# Patient Record
Sex: Male | Born: 1990 | Race: White | Hispanic: No | Marital: Married | Smoking: Former smoker
Health system: Southern US, Community
[De-identification: ages and names within clinical notes are randomized; demographics above are authoritative.]

## PROBLEM LIST (undated history)

## (undated) DIAGNOSIS — F410 Panic disorder [episodic paroxysmal anxiety] without agoraphobia: Secondary | ICD-10-CM

## (undated) DIAGNOSIS — K219 Gastro-esophageal reflux disease without esophagitis: Secondary | ICD-10-CM

---
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
## 2010-03-29 ENCOUNTER — Ambulatory Visit (HOSPITAL_COMMUNITY): Admission: RE | Admit: 2010-03-29 | Discharge: 2010-03-29 | Payer: Self-pay | Admitting: Family Medicine

## 2010-11-11 ENCOUNTER — Encounter: Payer: Self-pay | Admitting: Family Medicine

## 2011-02-14 ENCOUNTER — Emergency Department (HOSPITAL_COMMUNITY)
Admission: EM | Admit: 2011-02-14 | Discharge: 2011-02-14 | Discharge status: Against Medical Advice | Payer: Self-pay | Attending: Emergency Medicine | Admitting: Emergency Medicine

## 2011-07-30 LAB — URINALYSIS, ROUTINE W REFLEX MICROSCOPIC
Glucose, UA: NEGATIVE
Hgb urine dipstick: NEGATIVE
Specific Gravity, Urine: 1.025
pH: 6.5

## 2011-08-01 LAB — BASIC METABOLIC PANEL
CO2: 28
Chloride: 108
Creatinine, Ser: 1.07
GFR calc Af Amer: >60
Potassium: 3.6
Sodium: 140

## 2011-08-01 LAB — CBC
Hemoglobin: 14.8
MCHC: 33.5
MCV: 89.4
RBC: 4.94
WBC: 10

## 2011-08-01 LAB — DIFFERENTIAL
Eosinophils Absolute: 0.1
Lymphs Abs: 1.9
Monocytes Absolute: 0.7
Monocytes Relative: 7
Neutrophils Relative %: 73 — ABNORMAL HIGH

## 2011-08-01 LAB — URINALYSIS, ROUTINE W REFLEX MICROSCOPIC
Bilirubin Urine: NEGATIVE
Glucose, UA: NEGATIVE
Protein, ur: NEGATIVE
Specific Gravity, Urine: >1.03 — ABNORMAL HIGH

## 2011-08-01 LAB — URINE MICROSCOPIC-ADD ON

## 2013-07-06 ENCOUNTER — Encounter (HOSPITAL_COMMUNITY): Payer: Self-pay | Admitting: Emergency Medicine

## 2013-07-06 ENCOUNTER — Emergency Department (HOSPITAL_COMMUNITY)
Admission: EM | Admit: 2013-07-06 | Discharge: 2013-07-06 | Discharge status: Against Medical Advice | Payer: PRIVATE HEALTH INSURANCE | Attending: Emergency Medicine | Admitting: Emergency Medicine

## 2013-07-06 DIAGNOSIS — H6120 Impacted cerumen, unspecified ear: Secondary | ICD-10-CM | POA: Insufficient documentation

## 2013-07-06 DIAGNOSIS — H9209 Otalgia, unspecified ear: Secondary | ICD-10-CM | POA: Insufficient documentation

## 2013-07-06 DIAGNOSIS — F17200 Nicotine dependence, unspecified, uncomplicated: Secondary | ICD-10-CM | POA: Insufficient documentation

## 2013-07-06 NOTE — ED Notes (Signed)
Pt c/o left ear pain and states he cannot hear well. Pt also c/o dental pain.

## 2013-07-06 NOTE — ED Notes (Signed)
Pt c/o left ear pain and states he cannot hear well out of that ear.

## 2013-07-07 ENCOUNTER — Emergency Department (HOSPITAL_COMMUNITY)
Admission: EM | Admit: 2013-07-07 | Discharge: 2013-07-07 | Disposition: A | Discharge status: Home / Self Care | Payer: PRIVATE HEALTH INSURANCE | Attending: Emergency Medicine | Admitting: Emergency Medicine

## 2013-07-07 DIAGNOSIS — H6122 Impacted cerumen, left ear: Secondary | ICD-10-CM

## 2013-07-07 MED ORDER — CARBAMIDE PEROXIDE 6.5 % OT SOLN: 5.0000 [drp] | Freq: Two times a day (BID) | OTIC | Status: AC

## 2013-07-09 NOTE — ED Provider Notes (Signed)
CSN: [id number]     Arrival date & time 07/06/13  2201 History   First MD Initiated Contact with Patient 07/07/13 0006     Chief Complaint  Patient presents with  . Otalgia   (Consider location/radiation/quality/duration/timing/severity/associated sxs/prior Treatment) Patient is a 22 y.o. male presenting with ear pain. The history is provided by the patient.  Otalgia Location:  Left Behind ear:  No abnormality Quality:  Pressure and dull Severity:  Mild Onset quality:  Gradual Duration:  1 week Timing:  Constant Progression:  Worsening Chronicity:  New Context: not direct blow and not foreign body in ear   Relieved by:  Nothing Worsened by:  Nothing tried Ineffective treatments: He has tried to remove cerumen using a car key. Associated symptoms: hearing loss   Associated symptoms: no congestion, no ear discharge, no fever, no headaches, no neck pain, no sore throat and no tinnitus   Associated symptoms comment:  He reports the pain radiates to his left jaw   History reviewed. No pertinent past medical history. History reviewed. No pertinent past surgical history. History reviewed. No pertinent family history. History  Substance Use Topics  . Smoking status: Light Tobacco Smoker  . Smokeless tobacco: Not on file  . Alcohol Use: Yes    Review of Systems  Constitutional: Negative for fever.  HENT: Positive for hearing loss and ear pain. Negative for congestion, sore throat, facial swelling, neck pain, tinnitus and ear discharge.   Eyes: Negative.   Respiratory: Negative for shortness of breath.   Cardiovascular: Negative for chest pain.  Gastrointestinal: Negative for nausea.  Skin: Negative.   Neurological: Negative for dizziness and headaches.  Psychiatric/Behavioral: Negative.     Allergies  Chocolate  Home Medications   Current Outpatient Rx  Name  Route  Sig  Dispense  Refill  . carbamide peroxide (DEBROX) 6.5 % otic solution   Left Ear   Place 5 drops  into the left ear 2 (two) times daily.   15 mL   0    BP 143/77  Pulse 84  Temp(Src) 98.3 F (36.8 C) (Oral)  Resp 20  Ht 5\' 6"  (1.676 m)  Wt 193 lb (87.544 kg)  BMI 31.17 kg/m2  SpO2 99% Physical Exam  Constitutional: He is oriented to person, place, and time. He appears well-developed and well-nourished.  HENT:  Head: Normocephalic and atraumatic.  Right Ear: Tympanic membrane and ear canal normal.  Left Ear: A foreign body is present.  Nose: Mucosal edema and rhinorrhea present.  Mouth/Throat: Uvula is midline, oropharynx is clear and moist and mucous membranes are normal. No oropharyngeal exudate, posterior oropharyngeal edema, posterior oropharyngeal erythema or tonsillar abscesses.  Cerumen impaction left ear.  Eyes: Conjunctivae are normal.  Cardiovascular: Normal rate and normal heart sounds.   Pulmonary/Chest: Effort normal. No respiratory distress. He has no wheezes. He has no rales.  Abdominal: Soft. There is no tenderness.  Musculoskeletal: Normal range of motion.  Neurological: He is alert and oriented to person, place, and time.  Skin: Skin is warm and dry. No rash noted.  Psychiatric: He has a normal mood and affect.    ED Course  EAR CERUMEN REMOVAL Date/Time: 07/07/2013 12:45 AM Performed by: Burgess Amor Authorized by: Burgess Amor Consent: Verbal consent obtained. Risks and benefits: risks, benefits and alternatives were discussed Consent given by: patient Patient identity confirmed: verbally with patient Time out: Immediately prior to procedure a "time out" was called to verify the correct patient, procedure, equipment, support staff and  site/side marked as required. Ceruminolytics applied: Ceruminolytics applied prior to the procedure. Location details: left ear Procedure type: curette and irrigation Patient tolerance: Patient tolerated the procedure well with no immediate complications. Comments: Moderate amount of cerumen removed,  But still with  cerumen occluding the canal after multiple attempts to flush.  Unable to visualize the TM still after flushing   (including critical care time) Labs Review Labs Reviewed - No data to display Imaging Review No results found.  MDM   1. Cerumen impaction, left    Cerumen impaction.  Pt was prescribed debrox, encouraged to use daily for 5 days,  Then get rechecked by his pcp for additional irrigation.  Pt understands plan.    Burgess Amor, PA-C 07/09/13 1329

## 2013-07-11 NOTE — ED Provider Notes (Signed)
Medical screening examination/treatment/procedure(s) were performed by non-physician practitioner and as supervising physician I was immediately available for consultation/collaboration.  Geoffery Lyons, MD 07/11/13 [phone]

## 2013-12-03 ENCOUNTER — Emergency Department (HOSPITAL_COMMUNITY)
Admission: EM | Admit: 2013-12-03 | Discharge: 2013-12-03 | Disposition: A | Discharge status: Home / Self Care | Payer: PRIVATE HEALTH INSURANCE | Attending: Emergency Medicine | Admitting: Emergency Medicine

## 2013-12-03 ENCOUNTER — Encounter (HOSPITAL_COMMUNITY): Payer: Self-pay | Admitting: Emergency Medicine

## 2013-12-03 DIAGNOSIS — M545 Low back pain, unspecified: Secondary | ICD-10-CM

## 2013-12-03 DIAGNOSIS — R350 Frequency of micturition: Secondary | ICD-10-CM | POA: Insufficient documentation

## 2013-12-03 DIAGNOSIS — Z8659 Personal history of other mental and behavioral disorders: Secondary | ICD-10-CM | POA: Insufficient documentation

## 2013-12-03 DIAGNOSIS — M79609 Pain in unspecified limb: Secondary | ICD-10-CM | POA: Insufficient documentation

## 2013-12-03 DIAGNOSIS — F17200 Nicotine dependence, unspecified, uncomplicated: Secondary | ICD-10-CM | POA: Insufficient documentation

## 2013-12-03 HISTORY — DX: Panic disorder (episodic paroxysmal anxiety): F41.0

## 2013-12-03 LAB — URINALYSIS, ROUTINE W REFLEX MICROSCOPIC
BILIRUBIN URINE: NEGATIVE
Glucose, UA: NEGATIVE mg/dL
KETONES UR: NEGATIVE mg/dL
Leukocytes, UA: NEGATIVE
Nitrite: NEGATIVE
PROTEIN: NEGATIVE mg/dL
Specific Gravity, Urine: 1.025 (ref 1.005–1.030)
UROBILINOGEN UA: 1 mg/dL (ref 0.0–1.0)
pH: 6.5 (ref 5.0–8.0)

## 2013-12-03 LAB — URINE MICROSCOPIC-ADD ON

## 2013-12-03 MED ORDER — CYCLOBENZAPRINE HCL 10 MG PO TABS: 10.0000 mg | ORAL_TABLET | Freq: Three times a day (TID) | ORAL | Status: DC | PRN

## 2013-12-03 MED ORDER — IBUPROFEN 800 MG PO TABS: 800.0000 mg | ORAL_TABLET | Freq: Three times a day (TID) | ORAL | Status: DC

## 2013-12-03 NOTE — Discharge Instructions (Signed)
Back Pain, Adult  Back pain is very common. The pain often gets better over time. The cause of back pain is usually not dangerous. Most people can learn to manage their back pain on their own.   HOME CARE   · Stay active. Start with short walks on flat ground if you can. Try to walk farther each day.  · Do not sit, drive, or stand in one place for more than 30 minutes. Do not stay in bed.  · Do not avoid exercise or work. Activity can help your back heal faster.  · Be careful when you bend or lift an object. Bend at your knees, keep the object close to you, and do not twist.  · Sleep on a firm mattress. Lie on your side, and bend your knees. If you lie on your back, put a pillow under your knees.  · Only take medicines as told by your doctor.  · Put ice on the injured area.  · Put ice in a plastic bag.  · Place a towel between your skin and the bag.  · Leave the ice on for 15-20 minutes, 03-04 times a day for the first 2 to 3 days. After that, you can switch between ice and heat packs.  · Ask your doctor about back exercises or massage.  · Avoid feeling anxious or stressed. Find good ways to deal with stress, such as exercise.  GET HELP RIGHT AWAY IF:   · Your pain does not go away with rest or medicine.  · Your pain does not go away in 1 week.  · You have new problems.  · You do not feel well.  · The pain spreads into your legs.  · You cannot control when you poop (bowel movement) or pee (urinate).  · Your arms or legs feel weak or lose feeling (numbness).  · You feel sick to your stomach (nauseous) or throw up (vomit).  · You have belly (abdominal) pain.  · You feel like you may pass out (faint).  MAKE SURE YOU:   · Understand these instructions.  · Will watch your condition.  · Will get help right away if you are not doing well or get worse.  Document Released: 03/25/2008 Document Revised: 12/30/2011 Document Reviewed: 02/25/2011  ExitCare® Patient Information ©2014 [employer].

## 2013-12-03 NOTE — ED Notes (Signed)
Patient c/o lower back pain that started yesterday and is progressively getting worse. Denies any known injury. CNS intact.

## 2013-12-03 NOTE — ED Notes (Signed)
Ambulatory into tx room. PA has already examined, U/a to lab

## 2013-12-03 NOTE — ED Provider Notes (Signed)
CSN: [id number]     Arrival date & time 12/03/13  1655 History   First MD Initiated Contact with Patient 12/03/13 1731     Chief Complaint  Patient presents with  . Back Pain     (Consider location/radiation/quality/duration/timing/severity/associated sxs/prior Treatment) Patient is a 23 y.o. male presenting with back pain. The history is provided by the patient.  Back Pain Location:  Lumbar spine Quality:  Aching and stiffness Stiffness is present:  All day Radiates to:  L posterior upper leg and R posterior upper leg Pain severity:  Moderate Pain is:  Same all the time Onset quality:  Gradual Duration:  1 day Timing:  Constant Progression:  Unchanged Chronicity:  New Context: twisting   Context comment:  Patient is unsure of known injury but states that he does a lot of squatting at his job. Relieved by:  Bed rest and being still Worsened by:  Twisting, sitting and bending Ineffective treatments:  None tried Associated symptoms: leg pain   Associated symptoms: no abdominal pain, no abdominal swelling, no bladder incontinence, no bowel incontinence, no chest pain, no fever, no headaches, no numbness, no paresthesias, no pelvic pain, no perianal numbness, no tingling and no weakness   Associated symptoms comment:  Patient also states that he is urinating frequently but denies hematuria, penile discharge, flank pain, burning with urination or testicular tenderness Risk factors: no lack of exercise and no recent surgery     Past Medical History  Diagnosis Date  . Panic attacks    History reviewed. No pertinent past surgical history. Family History  Problem Relation Age of Onset  . Diabetes Other    History  Substance Use Topics  . Smoking status: Light Tobacco Smoker -- 0.03 packs/day for 9 years    Types: Cigarettes  . Smokeless tobacco: Former NeurosurgeonUser    Types: Chew  . Alcohol Use: 3.0 oz/week    5 Cans of beer per week    Review of Systems  Constitutional: Negative  for fever.  Respiratory: Negative for chest tightness and shortness of breath.   Cardiovascular: Negative for chest pain.  Gastrointestinal: Negative for nausea, vomiting, abdominal pain, constipation, abdominal distention and bowel incontinence.  Genitourinary: Positive for frequency. Negative for bladder incontinence, urgency, hematuria, flank pain, decreased urine volume, discharge, penile swelling, scrotal swelling, difficulty urinating, genital sores, penile pain, testicular pain and pelvic pain.       No perineal numbness or incontinence of urine or feces  Musculoskeletal: Positive for back pain. Negative for gait problem, joint swelling, neck pain and neck stiffness.  Skin: Negative for rash.  Neurological: Negative for dizziness, tingling, syncope, weakness, numbness, headaches and paresthesias.  All other systems reviewed and are negative.      Allergies  Chocolate and Sunflower oil  Home Medications  No current outpatient prescriptions on file. BP 134/71  Pulse 66  Temp(Src) 98.1 F (36.7 C) (Oral)  Resp 16  Ht 5\' 5"  (1.651 m)  Wt 200 lb (90.719 kg)  BMI 33.28 kg/m2  SpO2 99% Physical Exam  Nursing note and vitals reviewed. Constitutional: He is oriented to person, place, and time. He appears well-developed and well-nourished. No distress.  HENT:  Head: Normocephalic and atraumatic.  Neck: Normal range of motion. Neck supple.  Cardiovascular: Normal rate, regular rhythm, normal heart sounds and intact distal pulses.   No murmur heard. Pulmonary/Chest: Effort normal and breath sounds normal. No respiratory distress. He exhibits no tenderness.  Abdominal: Soft. Normal appearance. He exhibits no distension  and no mass. There is no hepatosplenomegaly. There is no tenderness. There is no rebound, no guarding and no CVA tenderness.  Musculoskeletal: He exhibits tenderness. He exhibits no edema.       Lumbar back: He exhibits tenderness and pain. He exhibits normal range of  motion, no swelling, no deformity, no laceration and normal pulse.  ttp of the mid lumbar spine and paraspinal muscles. DP pulses are brisk and symmetrical.  Distal sensation intact.  Hip Flexors/Extensors are intact  Neurological: He is alert and oriented to person, place, and time. He has normal strength. No sensory deficit. He exhibits normal muscle tone. Coordination and gait normal.  Reflex Scores:      Patellar reflexes are 2+ on the right side and 2+ on the left side.      Achilles reflexes are 2+ on the right side and 2+ on the left side. Skin: Skin is warm and dry. No rash noted.    ED Course  Procedures (including critical care time) Labs Review Labs Reviewed  URINALYSIS, ROUTINE W REFLEX MICROSCOPIC - Abnormal; Notable for the following:    Hgb urine dipstick SMALL (*)    All other components within normal limits  URINE MICROSCOPIC-ADD ON   Imaging Review No results found.  EKG Interpretation   None       MDM   Final diagnoses:  Low back pain   vitals, nursing notes and U/A results reviewed.     Diffuse lower back pain.  No CVA tenderness.  Pt reports urinary frequency w/o other urinary sx's.  No fever, abdominal pain, vomiting or CVA tenderness.  No hx of prostatitis or epididymitis .  Pt agrees to symptomatic treatment with ibuprofen and flexeril.  Restrict return precautions given.  He is ambulatory, NV intact.  Appears stable for d/c and agrees to plan.       Bryceson Grape L. Trisha Mangle, PA-C 12/05/13 1202

## 2013-12-05 NOTE — ED Provider Notes (Signed)
  Medical screening examination/treatment/procedure(s) were performed by non-physician practitioner and as supervising physician I was immediately available for consultation/collaboration.      Shallon Yaklin, MD 12/05/13 1518 

## 2013-12-11 ENCOUNTER — Encounter (HOSPITAL_COMMUNITY): Payer: Self-pay | Admitting: Emergency Medicine

## 2013-12-11 ENCOUNTER — Emergency Department (HOSPITAL_COMMUNITY): Payer: PRIVATE HEALTH INSURANCE

## 2013-12-11 ENCOUNTER — Emergency Department (HOSPITAL_COMMUNITY)
Admission: EM | Admit: 2013-12-11 | Discharge: 2013-12-11 | Disposition: A | Discharge status: Home / Self Care | Payer: PRIVATE HEALTH INSURANCE | Attending: Emergency Medicine | Admitting: Emergency Medicine

## 2013-12-11 DIAGNOSIS — F17200 Nicotine dependence, unspecified, uncomplicated: Secondary | ICD-10-CM | POA: Insufficient documentation

## 2013-12-11 DIAGNOSIS — F411 Generalized anxiety disorder: Secondary | ICD-10-CM | POA: Insufficient documentation

## 2013-12-11 DIAGNOSIS — R51 Headache: Secondary | ICD-10-CM | POA: Insufficient documentation

## 2013-12-11 DIAGNOSIS — R072 Precordial pain: Secondary | ICD-10-CM | POA: Insufficient documentation

## 2013-12-11 DIAGNOSIS — R55 Syncope and collapse: Secondary | ICD-10-CM

## 2013-12-11 DIAGNOSIS — R110 Nausea: Secondary | ICD-10-CM | POA: Insufficient documentation

## 2013-12-11 DIAGNOSIS — R42 Dizziness and giddiness: Secondary | ICD-10-CM | POA: Insufficient documentation

## 2013-12-11 DIAGNOSIS — F419 Anxiety disorder, unspecified: Secondary | ICD-10-CM

## 2013-12-11 LAB — RAPID URINE DRUG SCREEN, HOSP PERFORMED
AMPHETAMINES: NOT DETECTED
BENZODIAZEPINES: NOT DETECTED
Barbiturates: NOT DETECTED
Cocaine: NOT DETECTED
OPIATES: NOT DETECTED
Tetrahydrocannabinol: NOT DETECTED

## 2013-12-11 LAB — URINALYSIS, ROUTINE W REFLEX MICROSCOPIC
Bilirubin Urine: NEGATIVE
GLUCOSE, UA: NEGATIVE mg/dL
HGB URINE DIPSTICK: NEGATIVE
Ketones, ur: NEGATIVE mg/dL
LEUKOCYTES UA: NEGATIVE
Nitrite: NEGATIVE
PH: 6 (ref 5.0–8.0)
PROTEIN: NEGATIVE mg/dL
Specific Gravity, Urine: 1.025 (ref 1.005–1.030)
Urobilinogen, UA: 0.2 mg/dL (ref 0.0–1.0)

## 2013-12-11 LAB — CBC WITH DIFFERENTIAL/PLATELET
BASOS ABS: 0 10*3/uL (ref 0.0–0.1)
BASOS PCT: 0 % (ref 0–1)
EOS ABS: 0.1 10*3/uL (ref 0.0–0.7)
EOS PCT: 1 % (ref 0–5)
HEMATOCRIT: 42.4 % (ref 39.0–52.0)
HEMOGLOBIN: 14.5 g/dL (ref 13.0–17.0)
Lymphocytes Relative: 18 % (ref 12–46)
Lymphs Abs: 1.6 10*3/uL (ref 0.7–4.0)
MCH: 30.4 pg (ref 26.0–34.0)
MCHC: 34.2 g/dL (ref 30.0–36.0)
MCV: 88.9 fL (ref 78.0–100.0)
MONO ABS: 0.5 10*3/uL (ref 0.1–1.0)
MONOS PCT: 6 % (ref 3–12)
Neutro Abs: 6.6 10*3/uL (ref 1.7–7.7)
Neutrophils Relative %: 76 % (ref 43–77)
Platelets: 190 10*3/uL (ref 150–400)
RBC: 4.77 MIL/uL (ref 4.22–5.81)
RDW: 12.3 % (ref 11.5–15.5)
WBC: 8.7 10*3/uL (ref 4.0–10.5)

## 2013-12-11 LAB — COMPREHENSIVE METABOLIC PANEL
ALBUMIN: 3.9 g/dL (ref 3.5–5.2)
ALT: 57 U/L — ABNORMAL HIGH (ref 0–53)
AST: 32 U/L (ref 0–37)
Alkaline Phosphatase: 46 U/L (ref 39–117)
BILIRUBIN TOTAL: 0.6 mg/dL (ref 0.3–1.2)
BUN: 19 mg/dL (ref 6–23)
CALCIUM: 9.2 mg/dL (ref 8.4–10.5)
CO2: 28 mEq/L (ref 19–32)
CREATININE: 1.09 mg/dL (ref 0.50–1.35)
Chloride: 102 mEq/L (ref 96–112)
GFR calc Af Amer: >90 mL/min (ref >90)
Glucose, Bld: 104 mg/dL — ABNORMAL HIGH (ref 70–99)
Potassium: 3.9 mEq/L (ref 3.7–5.3)
Sodium: 139 mEq/L (ref 137–147)
Total Protein: 7.1 g/dL (ref 6.0–8.3)

## 2013-12-11 LAB — D-DIMER, QUANTITATIVE (NOT AT ARMC)

## 2013-12-11 LAB — TROPONIN I

## 2013-12-11 NOTE — ED Provider Notes (Signed)
CSN: [id number]     Arrival date & time 12/11/13  1751 History   This chart was scribed for Glynn Octave, MD by Bennett Scrape, ED Scribe. This patient was seen in room [facility]10/[facility]10 and the patient's care was started at 6:10 PM.   Chief Complaint  Patient presents with  . Anxiety     The history is provided by the patient. No language interpreter was used.    HPI Comments: Derek White is a 23 y.o. male with a h/o anxiety currently unmedicated who presents to the Emergency Department by ambulance complaining of one episode of syncope while at work that he attributes to anxiety. Pt states that he was standing up taking the trash out when he felt lightheaded and nauseated when he "blacked out" 1 second later. He denies any blurred or double vision at the time. He denies any head trauma. He states that he was found by his coworkers. He reports having mid-sternal to left CP that started 30 minutes and a mild HA prior to the syncopal that he attributes to anxiety. He has a h/o panic attacks and reports that he felt a panic attack starting at work prior to the syncopal episode. He denies any lightheadedness, CP, HA or nausea currently. He denies any neck or back pain. He denies SI or HI. He denies being on any medications for anxiety. He denies being on any daily medications.    Past Medical History  Diagnosis Date  . Panic attacks    History reviewed. No pertinent past surgical history. Family History  Problem Relation Age of Onset  . Diabetes Other    History  Substance Use Topics  . Smoking status: Light Tobacco Smoker -- 0.03 packs/day for 9 years    Types: Cigarettes  . Smokeless tobacco: Former Neurosurgeon    Types: Chew  . Alcohol Use: 3.0 oz/week    5 Cans of beer per week    Review of Systems  A complete 10 system review of systems was obtained and all systems are negative except as noted in the HPI and PMH.   Allergies  Cinnamon; Chocolate; and Sunflower oil  Home  Medications   No current outpatient prescriptions on file. Triage Vitals: BP 129/67  Pulse 60  Temp(Src) 98 F (36.7 C)  Resp 12  Ht 5\' 5"  (1.651 m)  Wt 200 lb (90.719 kg)  BMI 33.28 kg/m2  SpO2 98%  Physical Exam  Nursing note and vitals reviewed. Constitutional: He is oriented to person, place, and time. He appears well-developed and well-nourished. No distress.  HENT:  Head: Normocephalic and atraumatic.  Eyes: Conjunctivae and EOM are normal. Pupils are equal, round, and reactive to light.  Neck: Neck supple. No tracheal deviation present.  Cardiovascular: Normal rate and regular rhythm.   No murmur heard. Pulmonary/Chest: Effort normal and breath sounds normal. No respiratory distress.  Abdominal: Soft. There is no tenderness. There is no rebound and no guarding.  Musculoskeletal: Normal range of motion.  No C, T or L spine tenderness   Neurological: He is alert and oriented to person, place, and time.  Slow to respond, CN 2-12 intact, no ataxia on finger to nose, no nystagmus, 5/5 strength throughout, no pronator drift  Skin: Skin is warm and dry.  Psychiatric: He has a normal mood and affect. His behavior is normal.    ED Course  Procedures (including critical care time)  DIAGNOSTIC STUDIES: Oxygen Saturation is 98% on RA, normal by my interpretation.  COORDINATION OF CARE: 6:16 PM-Discussed treatment plan which includes CXR, CBC panel, CMP and D-dimer with pt at bedside and pt agreed to plan.   7:47 PM- Cardiologist reports that EKG has no concerning changes.  8:17 PM-Pt rechecked and feels improved. Informed pt of wok up thus far. Advised pt that I believe his symptoms were a vasovagal response to going from a sitting position to a standing position quickly. Will work towards discharge and pt is agreeable.   Labs Review Labs Reviewed  COMPREHENSIVE METABOLIC PANEL - Abnormal; Notable for the following:    Glucose, Bld 104 (*)    ALT 57 (*)    All other  components within normal limits  CBC WITH DIFFERENTIAL  TROPONIN I  D-DIMER, QUANTITATIVE  URINE RAPID DRUG SCREEN (HOSP PERFORMED)  URINALYSIS, ROUTINE W REFLEX MICROSCOPIC   Imaging Review Dg Chest 2 View  12/11/2013   CLINICAL DATA:  Syncope.  EXAM: CHEST  2 VIEW  COMPARISON:  None.  FINDINGS: The heart size and mediastinal contours are within normal limits. Both lungs are clear. The visualized skeletal structures are unremarkable.  IMPRESSION: No active cardiopulmonary disease.   Electronically Signed   By: Myles RosenthalJohn  Stahl M.D.   On: 12/11/2013 19:31   Ct Head Wo Contrast  12/11/2013   CLINICAL DATA:  Anxiety.  EXAM: CT HEAD WITHOUT CONTRAST  TECHNIQUE: Contiguous axial images were obtained from the base of the skull through the vertex without intravenous contrast.  COMPARISON:  None.  FINDINGS: No mass. No hydrocephalus. No hemorrhage. Orbits are unremarkable. Paranasal sinuses are clear. No acute bony abnormality.  IMPRESSION: Negative exam.   Electronically Signed   By: Maisie Fushomas  Register   On: 12/11/2013 19:38    EKG Interpretation    Date/Time:  Saturday December 11 2013 20:48:31 EST Ventricular Rate:  52 PR Interval:  174 QRS Duration: 96 QT Interval:  400 QTC Calculation: 372 R Axis:   35 Text Interpretation:  Sinus bradycardia Incomplete right bundle branch block Borderline ECG When compared with ECG of 28-Jul-2007 18:36, incomplete RBBB new Confirmed by Manus GunningANCOUR  MD, Beryle Zeitz ([id number]) on 12/11/2013 8:58:32 PM            MDM   Final diagnoses:  Syncope  Anxiety  syncopal episode at work after patient admits to being stressed out and having some tightness in his chest. Proceeded by lightheadedness and dizziness. Alert and oriented at this time. Denies pain complaints. No reported seizure activity.  EKG shows incomplete right bundle branch block. Neurologically intact. No focal deficits.  EKG discussed with on-call cardiology follow Dr. Hiram ComberLishmanov. He does not feel it  represents Brugada syndrome.  CT head negative. Troponin neg, D-dimer negative.  Ambulatory without assistance. Syncopal episode is likely vasovagal with component of anxiety. Patient feeling better and tolerating PO.  Denies SI or HI.  Follow up with PCP and cardiology as needed.  Date: 12/11/2013  Rate: 60  Rhythm: normal sinus rhythm  QRS Axis: normal  Intervals: normal  ST/T Wave abnormalities: nonspecific ST/T changes  Conduction Disutrbances:right bundle branch block  Narrative Interpretation: incomplete RBBB.  Old EKG Reviewed: none available    I personally performed the services described in this documentation, which was scribed in my presence. The recorded information has been reviewed and is accurate.      Glynn OctaveStephen Arael Piccione, MD 12/11/13 2226

## 2013-12-11 NOTE — ED Notes (Signed)
Per ems patient became stressed at work and passed out. ems states he was alert and oriented at time of arrival

## 2013-12-11 NOTE — Discharge Instructions (Signed)
Syncope There is no evidence of a serious cause of your passing out. Followup with her doctor. You should see the cardiologist as well she can be monitored for any abnormal heart rhythms. Return to the ED if you develop new or worsening symptoms. Syncope is a fainting spell. This means the person loses consciousness and drops to the ground. The person is generally unconscious for less than 5 minutes. The person may have some muscle twitches for up to 15 seconds before waking up and returning to normal. Syncope occurs more often in elderly people, but it can happen to anyone. While most causes of syncope are not dangerous, syncope can be a sign of a serious medical problem. It is important to seek medical care.  CAUSES  Syncope is caused by a sudden decrease in blood flow to the brain. The specific cause is often not determined. Factors that can trigger syncope include:  Taking medicines that lower blood pressure.  Sudden changes in posture, such as standing up suddenly.  Taking more medicine than prescribed.  Standing in one place for too long.  Seizure disorders.  Dehydration and excessive exposure to heat.  Low blood sugar (hypoglycemia).  Straining to have a bowel movement.  Heart disease, irregular heartbeat, or other circulatory problems.  Fear, emotional distress, seeing blood, or severe pain. SYMPTOMS  Right before fainting, you may:  Feel dizzy or lightheaded.  Feel nauseous.  See all white or all black in your field of vision.  Have cold, clammy skin. DIAGNOSIS  Your caregiver will ask about your symptoms, perform a physical exam, and perform electrocardiography (ECG) to record the electrical activity of your heart. Your caregiver may also perform other heart or blood tests to determine the cause of your syncope. TREATMENT  In most cases, no treatment is needed. Depending on the cause of your syncope, your caregiver may recommend changing or stopping some of your  medicines. HOME CARE INSTRUCTIONS  Have someone stay with you until you feel stable.  Do not drive, operate machinery, or play sports until your caregiver says it is okay.  Keep all follow-up appointments as directed by your caregiver.  Lie down right away if you start feeling like you might faint. Breathe deeply and steadily. Wait until all the symptoms have passed.  Drink enough fluids to keep your urine clear or pale yellow.  If you are taking blood pressure or heart medicine, get up slowly, taking several minutes to sit and then stand. This can reduce dizziness. SEEK IMMEDIATE MEDICAL CARE IF:   You have a severe headache.  You have unusual pain in the chest, abdomen, or back.  You are bleeding from the mouth or rectum, or you have black or tarry stool.  You have an irregular or very fast heartbeat.  You have pain with breathing.  You have repeated fainting or seizure-like jerking during an episode.  You faint when sitting or lying down.  You have confusion.  You have difficulty walking.  You have severe weakness.  You have vision problems. If you fainted, call your local emergency services (911 in [place] Do not drive yourself to the hospital.  MAKE SURE YOU:  Understand these instructions.  Will watch your condition.  Will get help right away if you are not doing well or get worse. Document Released: 10/07/2005 Document Revised: 04/07/2012 Document Reviewed: 12/06/2011 [facility] Patient Information 2014 [employer]Care, [place].

## 2014-10-06 ENCOUNTER — Emergency Department (HOSPITAL_COMMUNITY)
Admission: EM | Admit: 2014-10-06 | Discharge: 2014-10-06 | Disposition: A | Discharge status: Home / Self Care | Payer: PRIVATE HEALTH INSURANCE | Attending: Emergency Medicine | Admitting: Emergency Medicine

## 2014-10-06 ENCOUNTER — Encounter (HOSPITAL_COMMUNITY): Payer: Self-pay | Admitting: Emergency Medicine

## 2014-10-06 DIAGNOSIS — Z8659 Personal history of other mental and behavioral disorders: Secondary | ICD-10-CM | POA: Insufficient documentation

## 2014-10-06 DIAGNOSIS — Z0289 Encounter for other administrative examinations: Secondary | ICD-10-CM | POA: Insufficient documentation

## 2014-10-06 DIAGNOSIS — Z720 Tobacco use: Secondary | ICD-10-CM | POA: Insufficient documentation

## 2014-10-06 DIAGNOSIS — Z139 Encounter for screening, unspecified: Secondary | ICD-10-CM

## 2014-10-06 NOTE — ED Notes (Signed)
Patient reports having vomiting x2 this morning. Denies any pain, diarrhea, or pain. Per patient no longer vomiting. Denies any nausea. Per patient "basically had to come for work note."

## 2014-10-08 NOTE — ED Provider Notes (Signed)
CSN: [id number]     Arrival date & time 10/06/14  1653 History   First MD Initiated Contact with Patient 10/06/14 1752     Chief Complaint  Patient presents with  . Emesis     (Consider location/radiation/quality/duration/timing/severity/associated sxs/prior Treatment) HPI   Derek White is a 23 y.o. male who presents to the Emergency Department requesting a return to work note.  He states that he had two episodes of vomiting this morning and did not feel well enough to go to work.  He states that he now feels better and has tolerated fluids and food without further vomiting.  He denies nausea., fever, abdominal pain, diarrhea or further vomiting.     Past Medical History  Diagnosis Date  . Panic attacks    History reviewed. No pertinent past surgical history. Family History  Problem Relation Age of Onset  . Diabetes Other    History  Substance Use Topics  . Smoking status: Light Tobacco Smoker -- 0.03 packs/day for 9 years    Types: Cigarettes  . Smokeless tobacco: Former NeurosurgeonUser    Types: Chew  . Alcohol Use: 3.0 oz/week    5 Cans of beer per week    Review of Systems  Constitutional: Negative for fever, chills and appetite change.  Respiratory: Negative for shortness of breath.   Cardiovascular: Negative for chest pain.  Gastrointestinal: Negative for nausea, vomiting, abdominal pain, blood in stool and abdominal distention.  Genitourinary: Negative for dysuria, flank pain, decreased urine volume and difficulty urinating.  Musculoskeletal: Negative for back pain.  Skin: Negative for color change and rash.  Neurological: Negative for dizziness, weakness and numbness.  Hematological: Negative for adenopathy.  All other systems reviewed and are negative.     Allergies  Cinnamon; Chocolate; and Sunflower oil  Home Medications   Prior to Admission medications   Not on File   BP 132/63 mmHg  Pulse 61  Temp(Src) 98.4 F (36.9 C) (Oral)  Resp 16  Ht 5'  5" (1.651 m)  Wt 180 lb (81.647 kg)  BMI 29.95 kg/m2  SpO2 100% Physical Exam  Constitutional: He is oriented to person, place, and time. He appears well-developed and well-nourished. No distress.  HENT:  Head: Normocephalic and atraumatic.  Mouth/Throat: Oropharynx is clear and moist.  Neck: Neck supple.  Cardiovascular: Normal rate, regular rhythm and normal heart sounds.   No murmur heard. Pulmonary/Chest: Effort normal and breath sounds normal. No respiratory distress.  Abdominal: Soft. He exhibits no distension. There is no tenderness. There is no rebound and no guarding.  Musculoskeletal: Normal range of motion. He exhibits no tenderness.  Neurological: He is alert and oriented to person, place, and time. He exhibits normal muscle tone. Coordination normal.  Skin: Skin is warm and dry.  Nursing note and vitals reviewed.   ED Course  Procedures (including critical care time) Labs Review Labs Reviewed - No data to display  Imaging Review No results found.   EKG Interpretation None      MDM   Final diagnoses:  Encounter for medical screening examination    Pt here requesting a return ot work note.  Had two episodes of vomiting earlier, but sx's now resolved. abd is soft, NT.  Patient is well appearing, VSS.  Work note written.        Derek Schlender L. Trisha Mangleriplett, PA-C 10/08/14 1829  Derek BakerAnthony T Allen, MD 10/10/14 [phone]

## 2014-10-27 ENCOUNTER — Encounter (HOSPITAL_COMMUNITY): Payer: Self-pay | Admitting: *Deleted

## 2014-10-27 ENCOUNTER — Emergency Department (HOSPITAL_COMMUNITY)
Admission: EM | Admit: 2014-10-27 | Discharge: 2014-10-27 | Disposition: A | Discharge status: Home / Self Care | Payer: PRIVATE HEALTH INSURANCE | Attending: Emergency Medicine | Admitting: Emergency Medicine

## 2014-10-27 DIAGNOSIS — Z8659 Personal history of other mental and behavioral disorders: Secondary | ICD-10-CM | POA: Diagnosis not present

## 2014-10-27 DIAGNOSIS — H9203 Otalgia, bilateral: Secondary | ICD-10-CM | POA: Diagnosis present

## 2014-10-27 DIAGNOSIS — H6123 Impacted cerumen, bilateral: Secondary | ICD-10-CM

## 2014-10-27 DIAGNOSIS — Z720 Tobacco use: Secondary | ICD-10-CM | POA: Diagnosis not present

## 2014-10-27 DIAGNOSIS — H6501 Acute serous otitis media, right ear: Secondary | ICD-10-CM

## 2014-10-27 DIAGNOSIS — H6591 Unspecified nonsuppurative otitis media, right ear: Secondary | ICD-10-CM | POA: Diagnosis not present

## 2014-10-27 MED ORDER — AMOXICILLIN 500 MG PO CAPS: 500.0000 mg | ORAL_CAPSULE | Freq: Three times a day (TID) | ORAL | Status: DC

## 2014-10-27 MED ORDER — NEOMYCIN-POLYMYXIN-HC 3.5-10000-1 OT SUSP: 4.0000 [drp] | Freq: Three times a day (TID) | OTIC | Status: DC

## 2014-10-27 NOTE — ED Notes (Signed)
Pt with right earache for 5 days, same with left ear recently, pt with c/o HA and nausea, denies vomiting or diarrhea

## 2014-10-27 NOTE — ED Provider Notes (Signed)
CSN: [id number]     Arrival date & time 10/27/14  1626 History   First MD Initiated Contact with Patient 10/27/14 1651     Chief Complaint  Patient presents with  . Otalgia     (Consider location/radiation/quality/duration/timing/severity/associated sxs/prior Treatment) Patient is a 24 y.o. male presenting with ear pain. The history is provided by the patient.  Otalgia Location:  Bilateral Quality:  Aching and dull Severity:  Moderate Onset quality:  Gradual Duration:  5 days Timing:  Constant Progression:  Worsening Chronicity:  New Relieved by:  Nothing Ineffective treatments:  None tried  Derek White is a 24 y.o. male who presents to the ED with bilateral ear pain x 5 days. He states that his hearing is decreased, his ears feel full and often fells popping. He has noted a lot of wax. He denies fever or URI symptoms.  Past Medical History  Diagnosis Date  . Panic attacks    History reviewed. No pertinent past surgical history. Family History  Problem Relation Age of Onset  . Diabetes Other    History  Substance Use Topics  . Smoking status: Light Tobacco Smoker -- 0.03 packs/day for 9 years    Types: Cigarettes  . Smokeless tobacco: Former NeurosurgeonUser    Types: Chew  . Alcohol Use: 3.0 oz/week    5 Cans of beer per week    Review of Systems  HENT: Positive for ear pain.   all other systems negative.     Allergies  Cinnamon; Chocolate; and Sunflower oil  Home Medications   Prior to Admission medications   Medication Sig Start Date End Date Taking? Authorizing Provider  amoxicillin (AMOXIL) 500 MG capsule Take 1 capsule (500 mg total) by mouth 3 (three) times daily. 10/27/14   Terri Malerba Orlene OchM Margie Urbanowicz, NP  neomycin-polymyxin-hydrocortisone (CORTISPORIN) 3.5-10000-1 otic suspension Place 4 drops into both ears 3 (three) times daily. 10/27/14   Derek White Orlene OchM Shuan Statzer, NP   BP 124/85 mmHg  Pulse 77  Temp(Src) 98.1 F (36.7 C) (Oral)  Resp 18  Ht 5\' 5"  (1.651 m)  Wt 215 lb 5 oz  (97.665 kg)  BMI 35.83 kg/m2  SpO2 100% Physical Exam  Constitutional: He is oriented to person, place, and time. He appears well-developed and well-nourished. No distress.  HENT:  Head: Normocephalic.  Bilateral TM occluded by cerumen.   Eyes: EOM are normal.  Neck: Normal range of motion. Neck supple.  Cardiovascular: Normal rate.   Pulmonary/Chest: Effort normal.  Musculoskeletal: Normal range of motion.  Neurological: He is alert and oriented to person, place, and time. No cranial nerve deficit.  Skin: Skin is warm and dry.  Psychiatric: He has a normal mood and affect. His behavior is normal.  Nursing note and vitals reviewed.   ED Course  Procedures (including critical care time) Labs Review Ear irrigation with good results, large pieces of cerumen removed from both ear canals.  Patient tolerated the procedure without problems.  Patient re examined, right TM with erythema. MDM  24 y.o. male with right ear pain x 5 days and both ears feeling full. Stable for discharge without tenderness to the mastoid. Will treat for otitis media right and he will follow up with his doctor in one week or return here sooner for worsening symptoms.    Medication List    TAKE these medications        amoxicillin 500 MG capsule  Commonly known as:  AMOXIL  Take 1 capsule (500 mg total) by mouth  3 (three) times daily.     neomycin-polymyxin-hydrocortisone 3.5-10000-1 otic suspension  Commonly known as:  CORTISPORIN  Place 4 drops into both ears 3 (three) times daily.        Final diagnoses:  Cerumen impaction, bilateral  Right acute serous otitis media, recurrence not specified      Janne Napoleon, NP 10/28/14 0151  Juliet Rude. Rubin Payor, MD 10/28/14 [phone]

## 2014-10-27 NOTE — Discharge Instructions (Signed)
Follow up with your doctor in one week for recheck.

## 2016-09-20 ENCOUNTER — Emergency Department (HOSPITAL_COMMUNITY)
Admission: EM | Admit: 2016-09-20 | Discharge: 2016-09-21 | Disposition: A | Discharge status: Home / Self Care | Payer: PRIVATE HEALTH INSURANCE | Attending: Emergency Medicine | Admitting: Emergency Medicine

## 2016-09-20 ENCOUNTER — Encounter (HOSPITAL_COMMUNITY): Payer: Self-pay | Admitting: Emergency Medicine

## 2016-09-20 DIAGNOSIS — F17210 Nicotine dependence, cigarettes, uncomplicated: Secondary | ICD-10-CM | POA: Insufficient documentation

## 2016-09-20 DIAGNOSIS — H66002 Acute suppurative otitis media without spontaneous rupture of ear drum, left ear: Secondary | ICD-10-CM

## 2016-09-20 NOTE — ED Triage Notes (Signed)
Pain to lt ear x 3days

## 2016-09-21 MED ORDER — AMOXICILLIN 500 MG PO CAPS: 500.0000 mg | ORAL_CAPSULE | Freq: Three times a day (TID) | ORAL | 0 refills | Status: DC

## 2016-09-21 NOTE — ED Provider Notes (Signed)
[facility]-EMERGENCY DEPT Provider Note   CSN: [id number] Arrival date & time: 09/20/16  2334  By signing my name below, I, Derek JanskyAlbert White, attest that this documentation has been prepared under the direction and in the presence of Azalia BilisKevin Eriyah Fernando, MD . Electronically Signed: Modena JanskyAlbert White, Scribe. 09/21/2016. 12:17 AM.  History   Chief Complaint Chief Complaint  Patient presents with  . Otalgia   The history is provided by the patient. No language interpreter was used.    HPI Comments: Derek White A Parekh is a 25 y.o. male who presents to the Emergency Department complaining of constant moderate left ear pain that started 3 days ago ago. He describes the pain as radiating to his cheek. He tried sticking a bobby pin into the ear canal for pain without any relief. He admits to a prior hx of similar complaint due to cerumen impaction. He denies any sore throat, fever, or other complaints.    PCP: Derek RuthsMCGOUGH,WILLIAM M, MD  Past Medical History:  Diagnosis Date  . Panic attacks     There are no active problems to display for this patient.   History reviewed. No pertinent surgical history.     Home Medications    Prior to Admission medications   Medication Sig Start Date End Date Taking? Authorizing Provider  amoxicillin (AMOXIL) 500 MG capsule Take 1 capsule (500 mg total) by mouth 3 (three) times daily. 10/27/14  Yes Derek Orlene OchM Neese, NP  neomycin-polymyxin-hydrocortisone (CORTISPORIN) 3.5-10000-1 otic suspension Place 4 drops into both ears 3 (three) times daily. 10/27/14  Yes Derek Orlene OchM Neese, NP    Family History Family History  Problem Relation Age of Onset  . Diabetes Other     Social History Social History  Substance Use Topics  . Smoking status: Light Tobacco Smoker    Years: 9.00    Types: E-cigarettes  . Smokeless tobacco: Former NeurosurgeonUser    Types: Chew  . Alcohol use 3.0 oz/week    5 Cans of beer per week     Comment: 12 pack per week      Allergies   Cinnamon; Chocolate;  and Sunflower oil   Review of Systems Review of Systems A complete 10 system review of systems was obtained and all systems are negative except as noted in the HPI and PMH.   Physical Exam Updated Vital Signs BP 141/93 (BP Location: Left Arm)   Pulse 102   Temp 98.3 F (36.8 C) (Oral)   Resp 20   Ht 5\' 8"  (1.727 m)   Wt 215 lb (97.5 kg)   SpO2 100%   BMI 32.69 kg/m   Physical Exam  Constitutional: He is oriented to person, place, and time. He appears well-developed and well-nourished.  HENT:  Head: Normocephalic.  Mild erythema of the left TM with mild TM bulging.   Eyes: EOM are normal.  Neck: Normal range of motion.  Small lymphadenopathy to left lateral neck.   Pulmonary/Chest: Effort normal.  Abdominal: He exhibits no distension.  Musculoskeletal: Normal range of motion.  Neurological: He is alert and oriented to person, place, and time.  Psychiatric: He has a normal mood and affect.  Nursing note and vitals reviewed.    ED Treatments / Results  DIAGNOSTIC STUDIES: Oxygen Saturation is 100% on RA, normal by my interpretation.    COORDINATION OF CARE: 12:20 AM- Pt advised of plan for treatment and pt agrees.  Labs (all labs ordered are listed, but only abnormal results are displayed) Labs Reviewed - No data  to display  EKG  EKG Interpretation None       Radiology No results found.  Procedures Procedures (including critical care time)  Medications Ordered in ED Medications - No data to display   Initial Impression / Assessment and Plan / ED Course  I have reviewed the triage vital signs and the nursing notes.  Pertinent labs & imaging results that were available during my care of the patient were reviewed by me and considered in my medical decision making (see chart for details).  Clinical Course     Well-appearing.  Nontoxic.  Vitals without significant abnormality.  Patient be treated for an acute otitis media on the left.  No signs of  cerumen impaction  Final Clinical Impressions(s) / ED Diagnoses   Final diagnoses:  None    New Prescriptions New Prescriptions   No medications on file   I personally performed the services described in this documentation, which was scribed in my presence. The recorded information has been reviewed and is accurate.        Azalia BilisKevin Fawnda Vitullo, MD 09/21/16 [phone]

## 2018-07-08 ENCOUNTER — Other Ambulatory Visit: Payer: Self-pay

## 2018-07-08 ENCOUNTER — Emergency Department (HOSPITAL_COMMUNITY)
Admission: EM | Admit: 2018-07-08 | Discharge: 2018-07-08 | Disposition: A | Discharge status: Home / Self Care | Payer: PRIVATE HEALTH INSURANCE | Attending: Emergency Medicine | Admitting: Emergency Medicine

## 2018-07-08 ENCOUNTER — Encounter (HOSPITAL_COMMUNITY): Payer: Self-pay | Admitting: Emergency Medicine

## 2018-07-08 DIAGNOSIS — J329 Chronic sinusitis, unspecified: Secondary | ICD-10-CM | POA: Insufficient documentation

## 2018-07-08 DIAGNOSIS — H9203 Otalgia, bilateral: Secondary | ICD-10-CM

## 2018-07-08 DIAGNOSIS — F17290 Nicotine dependence, other tobacco product, uncomplicated: Secondary | ICD-10-CM | POA: Insufficient documentation

## 2018-07-08 MED ORDER — ACETAMINOPHEN 500 MG PO TABS
1000.0000 mg | ORAL_TABLET | Freq: Once | ORAL | Status: AC
  Administered 2018-07-08: 1000 mg via ORAL
  Filled 2018-07-08: qty 2

## 2018-07-08 MED ORDER — AMOXICILLIN 250 MG PO CAPS
500.0000 mg | ORAL_CAPSULE | Freq: Once | ORAL | Status: AC
  Administered 2018-07-08: 500 mg via ORAL
  Filled 2018-07-08: qty 2

## 2018-07-08 MED ORDER — IBUPROFEN 600 MG PO TABS: 600.0000 mg | ORAL_TABLET | Freq: Four times a day (QID) | ORAL | 0 refills | Status: DC

## 2018-07-08 MED ORDER — IBUPROFEN 800 MG PO TABS
800.0000 mg | ORAL_TABLET | Freq: Once | ORAL | Status: AC
  Administered 2018-07-08: 800 mg via ORAL
  Filled 2018-07-08: qty 1

## 2018-07-08 MED ORDER — DEXAMETHASONE 4 MG PO TABS: 4.0000 mg | ORAL_TABLET | Freq: Two times a day (BID) | ORAL | 0 refills | Status: DC

## 2018-07-08 MED ORDER — AMOXICILLIN 500 MG PO CAPS: 500.0000 mg | ORAL_CAPSULE | Freq: Three times a day (TID) | ORAL | 0 refills | Status: DC

## 2018-07-08 NOTE — ED Triage Notes (Signed)
Pt complaining of sore throat,ear pain and intermittent fever.

## 2018-07-08 NOTE — Discharge Instructions (Signed)
Your vital signs within normal limits.  Your oxygen level is 97% on room air.  Your examination suggest an ear infection and sinus infection.  There is also some mild redness of the throat.  Please use Amoxil 3 times daily with food.  Please use ibuprofen with breakfast, lunch, dinner, and at bedtime.  Use Decadron 2 times daily with a meal.  Please increase fluids.  Please wash your hands frequently.  Use your mask until symptoms have resolved.  Please see your primary physician or return to the emergency department if not improving.

## 2018-07-08 NOTE — ED Provider Notes (Signed)
[facility]ENC[facility]PART[facility] Provider Note   CSN: [id number] Arrival date & time: 07/08/18  1120     History   Chief Complaint Chief Complaint  Patient presents with  . Otalgia    HPI Derek White is a 27 y.o. male.  Patient is a 27 year old male who presents to the emergency department with a complaint of ear pain and sore throat.  The patient states that this problem started a few days ago.  He has had a temperature maximum of 101.  He complains of body aches, sore throat, ear pain.  He has not had diarrhea.  No unusual rash.  He has not been out of the country recently.  He is not sure if he has been around anyone else is been ill.  The history is provided by the patient.    Past Medical History:  Diagnosis Date  . Panic attacks     There are no active problems to display for this patient.   History reviewed. No pertinent surgical history.      Home Medications    Prior to Admission medications   Medication Sig Start Date End Date Taking? Authorizing Provider  amoxicillin (AMOXIL) 500 MG capsule Take 1 capsule (500 mg total) by mouth 3 (three) times daily. 09/21/16   Azalia Bilisampos, Kevin, MD    Family History Family History  Problem Relation Age of Onset  . Diabetes Other     Social History Social History   Tobacco Use  . Smoking status: Light Tobacco Smoker    Years: 9.00    Types: E-cigarettes  . Smokeless tobacco: Former NeurosurgeonUser    Types: Chew  Substance Use Topics  . Alcohol use: Yes    Alcohol/week: 5.0 standard drinks    Types: 5 Cans of beer per week    Comment: 12 pack per week   . Drug use: No     Allergies   Cinnamon; Chocolate; and Sunflower oil   Review of Systems Review of Systems  Constitutional: Positive for activity change, chills and fever.       All ROS Neg except as noted in HPI  HENT: Positive for congestion, postnasal drip, sinus pressure and sore throat. Negative for nosebleeds.   Eyes: Negative for photophobia  and discharge.  Respiratory: Negative for cough, shortness of breath and wheezing.   Cardiovascular: Negative for chest pain and palpitations.  Gastrointestinal: Negative for abdominal pain and blood in stool.  Genitourinary: Negative for dysuria, frequency and hematuria.  Musculoskeletal: Positive for myalgias. Negative for arthralgias, back pain and neck pain.  Skin: Negative.   Neurological: Negative for dizziness, seizures and speech difficulty.  Psychiatric/Behavioral: Negative for confusion and hallucinations.     Physical Exam Updated Vital Signs BP (!) 144/86 (BP Location: Right Arm)   Pulse 65   Temp 98.1 F (36.7 C) (Oral)   Resp 16   Wt 104.3 kg   SpO2 97%   BMI 34.97 kg/m   Physical Exam  Constitutional: He is oriented to person, place, and time. He appears well-developed and well-nourished.  Non-toxic appearance.  HENT:  Head: Normocephalic.  Right Ear: External ear normal. No drainage. No foreign bodies. No mastoid tenderness. Tympanic membrane is injected and erythematous. No middle ear effusion.  Left Ear: Tympanic membrane and external ear normal. No drainage. No foreign bodies. No mastoid tenderness. Tympanic membrane is not injected and not erythematous.  No middle ear effusion.  There is increased redness of the posterior pharynx.  Mild swelling of  the tonsils.  No exudate noted.  The airway is patent.  The uvula is in the midline.  Eyes: Pupils are equal, round, and reactive to light. EOM and lids are normal.  Neck: Normal range of motion. Neck supple. Carotid bruit is not present.  Cardiovascular: Normal rate, regular rhythm, normal heart sounds, intact distal pulses and normal pulses.  Pulmonary/Chest: Breath sounds normal. No respiratory distress.  Abdominal: Soft. Bowel sounds are normal. There is no tenderness. There is no guarding.  Musculoskeletal: Normal range of motion.  Lymphadenopathy:       Head (right side): No submandibular adenopathy present.         Head (left side): No submandibular adenopathy present.    He has no cervical adenopathy.  Neurological: He is alert and oriented to person, place, and time. He has normal strength. No cranial nerve deficit or sensory deficit.  Skin: Skin is warm and dry.  Psychiatric: He has a normal mood and affect. His speech is normal.  Nursing note and vitals reviewed.    ED Treatments / Results  Labs (all labs ordered are listed, but only abnormal results are displayed) Labs Reviewed - No data to display  EKG None  Radiology No results found.  Procedures Procedures (including critical care time)  Medications Ordered in ED Medications - No data to display   Initial Impression / Assessment and Plan / ED Course  I have reviewed the triage vital signs and the nursing notes.  Pertinent labs & imaging results that were available during my care of the patient were reviewed by me and considered in my medical decision making (see chart for details).       Final Clinical Impressions(s) / ED Diagnoses MDM Vital signs reviewed.  Pulse oximetry is 97% on room air.  Within normal limits by my interpretation.  The examination suggest sinusitis, pharyngitis, and otitis.  The patient will be treated with penicillin/amoxicillin.  I have asked the patient to use salt water gargles.  Patient will be treated with ibuprofen and Tylenol.  I have asked the patient to wash hands frequently and to keep his distance from others.  Work note has been provided for the patient.   Final diagnoses:  Otogenic otalgia of both ears  Sinusitis, unspecified chronicity, unspecified location    ED Discharge Orders         Ordered    amoxicillin (AMOXIL) 500 MG capsule  3 times daily     07/08/18 1251    dexamethasone (DECADRON) 4 MG tablet  2 times daily with meals     07/08/18 1251    ibuprofen (ADVIL,MOTRIN) 600 MG tablet  4 times daily     07/08/18 1251           Ivery Quale, PA-C 07/08/18  1719    Donnetta Hutching, MD 07/09/18 [phone]

## 2018-07-16 ENCOUNTER — Encounter (HOSPITAL_COMMUNITY): Payer: Self-pay | Admitting: *Deleted

## 2018-07-16 ENCOUNTER — Emergency Department (HOSPITAL_COMMUNITY)
Admission: EM | Admit: 2018-07-16 | Discharge: 2018-07-16 | Disposition: A | Discharge status: Home / Self Care | Payer: Self-pay | Attending: Emergency Medicine | Admitting: Emergency Medicine

## 2018-07-16 ENCOUNTER — Other Ambulatory Visit: Payer: Self-pay

## 2018-07-16 DIAGNOSIS — J029 Acute pharyngitis, unspecified: Secondary | ICD-10-CM | POA: Insufficient documentation

## 2018-07-16 DIAGNOSIS — F17290 Nicotine dependence, other tobacco product, uncomplicated: Secondary | ICD-10-CM | POA: Insufficient documentation

## 2018-07-16 MED ORDER — ACETAMINOPHEN 325 MG PO TABS
650.0000 mg | ORAL_TABLET | Freq: Once | ORAL | Status: AC
  Administered 2018-07-16: 650 mg via ORAL
  Filled 2018-07-16: qty 2

## 2018-07-16 MED ORDER — IBUPROFEN 800 MG PO TABS
800.0000 mg | ORAL_TABLET | Freq: Once | ORAL | Status: AC
  Administered 2018-07-16: 800 mg via ORAL
  Filled 2018-07-16: qty 1

## 2018-07-16 NOTE — ED Provider Notes (Signed)
[facility] EMERGENCY DEPARTMENT Provider Note   CSN: [id number] Arrival date & time: 07/16/18  2129     History   Chief Complaint Chief Complaint  Patient presents with  . Sore Throat    HPI Derek White is a 27 y.o. male.  The history is provided by the patient.  Sore Throat  The current episode started more than 1 week ago. The problem occurs daily. The problem has been gradually worsening. Pertinent negatives include no chest pain, no abdominal pain and no shortness of breath.    Past Medical History:  Diagnosis Date  . Panic attacks     There are no active problems to display for this patient.   History reviewed. No pertinent surgical history.      Home Medications    Prior to Admission medications   Medication Sig Start Date End Date Taking? Authorizing Provider  amoxicillin (AMOXIL) 500 MG capsule Take 1 capsule (500 mg total) by mouth 3 (three) times daily. 07/08/18   Ivery Quale, PA-C  dexamethasone (DECADRON) 4 MG tablet Take 1 tablet (4 mg total) by mouth 2 (two) times daily with a meal. 07/08/18   Ivery Quale, PA-C  ibuprofen (ADVIL,MOTRIN) 600 MG tablet Take 1 tablet (600 mg total) by mouth 4 (four) times daily. 07/08/18   Ivery Quale, PA-C    Family History Family History  Problem Relation Age of Onset  . Diabetes Other     Social History Social History   Tobacco Use  . Smoking status: Light Tobacco Smoker    Years: 9.00    Types: E-cigarettes  . Smokeless tobacco: Former Neurosurgeon    Types: Chew  Substance Use Topics  . Alcohol use: Yes    Alcohol/week: 5.0 standard drinks    Types: 5 Cans of beer per week    Comment: 12 pack per week   . Drug use: No     Allergies   Cinnamon; Chocolate; and Sunflower oil   Review of Systems Review of Systems  Constitutional: Negative for activity change.       All ROS Neg except as noted in HPI  HENT: Positive for ear pain and sore throat. Negative for nosebleeds.   Eyes: Negative  for photophobia and discharge.  Respiratory: Negative for cough, shortness of breath and wheezing.   Cardiovascular: Negative for chest pain and palpitations.  Gastrointestinal: Negative for abdominal pain and blood in stool.  Genitourinary: Negative for dysuria, frequency and hematuria.  Musculoskeletal: Negative for arthralgias, back pain and neck pain.  Skin: Negative.   Neurological: Negative for dizziness, seizures and speech difficulty.  Psychiatric/Behavioral: Negative for confusion and hallucinations.     Physical Exam Updated Vital Signs BP (!) 144/88 (BP Location: Right Arm)   Pulse 72   Temp 97.7 F (36.5 C) (Temporal)   Resp 16   Ht 5\' 8"  (1.727 m)   Wt 104.3 kg   SpO2 99%   BMI 34.97 kg/m   Physical Exam  Constitutional: He is oriented to person, place, and time. He appears well-developed and well-nourished.  Non-toxic appearance.  HENT:  Head: Normocephalic.  Right Ear: Tympanic membrane, external ear and ear canal normal. No drainage.  Left Ear: Tympanic membrane, external ear and ear canal normal. No drainage.  Minimal increase redness of the posterior pharynx. Uvula midline. Speech understandable.  Eyes: Pupils are equal, round, and reactive to light. EOM and lids are normal.  Neck: Normal range of motion. Neck supple. Carotid bruit is not present.  Mild to moderate increased redness of the posterior pharynx.  Uvula is midline.  Airway is patent.  Cardiovascular: Normal rate, regular rhythm, normal heart sounds, intact distal pulses and normal pulses.  Pulmonary/Chest: Breath sounds normal. No respiratory distress.  Abdominal: Soft. Bowel sounds are normal. There is no tenderness. There is no guarding.  Musculoskeletal: Normal range of motion.  Lymphadenopathy:       Head (right side): No submandibular adenopathy present.       Head (left side): No submandibular adenopathy present.    He has no cervical adenopathy.  Neurological: He is alert and oriented to  person, place, and time. He has normal strength. No cranial nerve deficit or sensory deficit.  Skin: Skin is warm and dry.  Psychiatric: He has a normal mood and affect. His speech is normal.  Nursing note and vitals reviewed.    ED Treatments / Results  Labs (all labs ordered are listed, but only abnormal results are displayed) Labs Reviewed - No data to display  EKG None  Radiology No results found.  Procedures Procedures (including critical care time)  Medications Ordered in ED Medications - No data to display   Initial Impression / Assessment and Plan / ED Course  I have reviewed the triage vital signs and the nursing notes.  Pertinent labs & imaging results that were available during my care of the patient were reviewed by me and considered in my medical decision making (see chart for details).       Final Clinical Impressions(s) / ED Diagnoses MDM  Vital signs are within normal limits.  Pulse oximetry is 99% on room air.  I have reviewed the previous emergency department records.  The increased redness of the tympanic membrane has resolved.  There remains mild increased redness of the posterior pharynx, the airway remains patent.  The vital signs are within normal limits.  I suspect that the patient has a viral upper respiratory infection.  The patient states that he did not get his prescriptions filled.  I have asked the patient to use Tylenol every 4 hours or ibuprofen every 6 hours for discomfort.  I have asked him to use salt water gargles and Chloraseptic Spray.  Of asked him to wash his hands frequently and to increase fluids.  Patient is to see the primary physician or return to the emergency department if any changes in condition, problems, or concerns.   Final diagnoses:  Viral pharyngitis    ED Discharge Orders    None       Ivery Quale, PA-C 07/16/18 2314    Samuel Jester, DO 07/17/18 2154

## 2018-07-16 NOTE — ED Triage Notes (Signed)
Pt c/o sore throat and stuffy ears x a week; pt was seen here last week for same complaint

## 2018-07-16 NOTE — ED Notes (Signed)
ED Provider at bedside. 

## 2018-07-16 NOTE — Discharge Instructions (Addendum)
Your vital signs are within normal limits.  The redness that was noted of your eardrum area is resolving.  There is mild increased redness of the back of your throat.  Please use salt water gargles.  Please use Tylenol every 4 hours, or ibuprofen every 6 hours for discomfort.  Chloraseptic Spray may also be helpful.  Please wash hands frequently.  Please increase fluids.  See your primary physician or return to the emergency department if any changes in your condition, problems, or concerns.

## 2018-07-28 ENCOUNTER — Encounter (HOSPITAL_COMMUNITY): Payer: Self-pay | Admitting: *Deleted

## 2018-07-28 ENCOUNTER — Emergency Department (HOSPITAL_COMMUNITY)
Admission: EM | Admit: 2018-07-28 | Discharge: 2018-07-28 | Disposition: A | Discharge status: Home / Self Care | Payer: Self-pay | Attending: Emergency Medicine | Admitting: Emergency Medicine

## 2018-07-28 ENCOUNTER — Other Ambulatory Visit: Payer: Self-pay

## 2018-07-28 DIAGNOSIS — Z79899 Other long term (current) drug therapy: Secondary | ICD-10-CM | POA: Insufficient documentation

## 2018-07-28 DIAGNOSIS — F17290 Nicotine dependence, other tobacco product, uncomplicated: Secondary | ICD-10-CM | POA: Insufficient documentation

## 2018-07-28 DIAGNOSIS — K219 Gastro-esophageal reflux disease without esophagitis: Secondary | ICD-10-CM | POA: Insufficient documentation

## 2018-07-28 MED ORDER — FAMOTIDINE 20 MG PO TABS: 20.0000 mg | ORAL_TABLET | Freq: Two times a day (BID) | ORAL | 0 refills | Status: DC

## 2018-07-28 MED ORDER — FAMOTIDINE 20 MG PO TABS
40.0000 mg | ORAL_TABLET | Freq: Once | ORAL | Status: AC
  Administered 2018-07-28: 40 mg via ORAL
  Filled 2018-07-28: qty 2

## 2018-07-28 NOTE — ED Triage Notes (Signed)
Pt c/o upper chest pain mostly at night while sleeping pt states he will feel the burning in his chest and he will wake up choking

## 2018-07-29 NOTE — ED Provider Notes (Signed)
[facility] EMERGENCY DEPARTMENT Provider Note   CSN: [id number] Arrival date & time: 07/28/18  1929     History   Chief Complaint Chief Complaint  Patient presents with  . Gastroesophageal Reflux    HPI Derek White is a 27 y.o. male.  The history is provided by the patient.  Gastroesophageal Reflux  This is a recurrent problem. Episode onset: Pt reports intermittent heartburn with worsening severe reflux recently waking at night with acid regurg and choking. Episode frequency: wirse at night when supine. The problem has been gradually worsening. Pertinent negatives include no chest pain, no abdominal pain, no headaches and no shortness of breath. Associated symptoms comments: He denies sob, denies fevers or productive cough, no wheezing.  He does have mid epigastric burning with radiation into throat when sx are severe.. Exacerbated by: lying down.   Relieved by: He has tried an otc generic antacid with minimal relief.  Has also used generic prilosec occasionally also with minimal improvement.   Pt denies unexplained weight loss, no n/v, abdominal pain.  He does endorse frequently eating before bedtime and enjoys spicy foods which tend to make sx worse.   Past Medical History:  Diagnosis Date  . Panic attacks     There are no active problems to display for this patient.   History reviewed. No pertinent surgical history.      Home Medications    Prior to Admission medications   Medication Sig Start Date End Date Taking? Authorizing Provider  amoxicillin (AMOXIL) 500 MG capsule Take 1 capsule (500 mg total) by mouth 3 (three) times daily. 07/08/18   Ivery Quale, PA-C  dexamethasone (DECADRON) 4 MG tablet Take 1 tablet (4 mg total) by mouth 2 (two) times daily with a meal. 07/08/18   Ivery Quale, PA-C  famotidine (PEPCID) 20 MG tablet Take 1-2 tablets (20-40 mg total) by mouth 2 (two) times daily. 07/28/18   Burgess Amor, PA-C  ibuprofen (ADVIL,MOTRIN) 600 MG  tablet Take 1 tablet (600 mg total) by mouth 4 (four) times daily. 07/08/18   Ivery Quale, PA-C    Family History Family History  Problem Relation Age of Onset  . Diabetes Other     Social History Social History   Tobacco Use  . Smoking status: Light Tobacco Smoker    Years: 9.00    Types: E-cigarettes  . Smokeless tobacco: Former Neurosurgeon    Types: Chew  Substance Use Topics  . Alcohol use: Yes    Alcohol/week: 5.0 standard drinks    Types: 5 Cans of beer per week    Comment: 12 pack per week   . Drug use: No     Allergies   Cinnamon; Chocolate; and Sunflower oil   Review of Systems Review of Systems  Constitutional: Negative for fever.  HENT: Negative for congestion, mouth sores, sore throat and trouble swallowing.   Eyes: Negative.   Respiratory: Positive for choking. Negative for cough, chest tightness, shortness of breath, wheezing and stridor.   Cardiovascular: Negative for chest pain.  Gastrointestinal: Negative for abdominal distention, abdominal pain, diarrhea and nausea.       Negative except as mentioned in HPI.   Genitourinary: Negative.   Musculoskeletal: Negative.  Negative for arthralgias.  Skin: Negative.   Neurological: Negative for dizziness, weakness, light-headedness, numbness and headaches.  Psychiatric/Behavioral: Negative.      Physical Exam Updated Vital Signs BP 139/81   Pulse 78   Temp 98.2 F (36.8 C) (Oral)   Resp 18  Ht 5\' 8"  (1.727 m)   Wt 108.9 kg   SpO2 98%   BMI 36.49 kg/m   Physical Exam  Constitutional: He appears well-developed and well-nourished.  Obese male in no acute distress.  HENT:  Head: Normocephalic and atraumatic.  Eyes: Conjunctivae are normal.  Neck: Normal range of motion.  Cardiovascular: Normal rate, regular rhythm, normal heart sounds and intact distal pulses.  Pulmonary/Chest: Effort normal and breath sounds normal. No stridor. No respiratory distress. He has no wheezes.  Abdominal: Soft. Bowel  sounds are normal. He exhibits no mass. There is no tenderness. There is no guarding.  Musculoskeletal: Normal range of motion.  Neurological: He is alert.  Skin: Skin is warm and dry.  Psychiatric: He has a normal mood and affect.  Nursing note and vitals reviewed.    ED Treatments / Results  Labs (all labs ordered are listed, but only abnormal results are displayed) Labs Reviewed - No data to display  EKG None  Radiology No results found.  Procedures Procedures (including critical care time)  Medications Ordered in ED Medications  famotidine (PEPCID) tablet 40 mg (40 mg Oral Given 07/28/18 2055)     Initial Impression / Assessment and Plan / ED Course  I have reviewed the triage vital signs and the nursing notes.  Pertinent labs & imaging results that were available during my care of the patient were reviewed by me and considered in my medical decision making (see chart for details).     Pt with classic acid reflux sx and multiple risk factors for this condition including weight, eating before bed, food choices.  He has no sx or exam findings suggesting need for imaging or lab testing.  Discussed various lifestyle changes which can help control reflux. Also discussed various medicines PPI's vs antacids.  Pt prefers no PPI's at present but was interested in trial of pepcid which was prescribed although he is aware is an otc.  Also discussed raising head of bed, avoiding late night meals, discussed typical culprit foods.  Warned pt that if sx persist or do not respond he will need further eval of sx, referral given to GI,  Also pcp referrals given.  The patient appears reasonably screened and/or stabilized for discharge and I doubt any other medical condition or other [facility] requiring further screening, evaluation, or treatment in the ED at this time prior to discharge.   Final Clinical Impressions(s) / ED Diagnoses   Final diagnoses:  Gastroesophageal reflux disease,  esophagitis presence not specified    ED Discharge Orders         Ordered    famotidine (PEPCID) 20 MG tablet  2 times daily     07/28/18 2052           Burgess Amor, PA-C 07/29/18 1341    Samuel Jester, DO 07/30/18 1420

## 2018-10-06 ENCOUNTER — Encounter (HOSPITAL_COMMUNITY): Payer: Self-pay | Admitting: Emergency Medicine

## 2018-10-06 ENCOUNTER — Emergency Department (HOSPITAL_COMMUNITY)
Admission: EM | Admit: 2018-10-06 | Discharge: 2018-10-06 | Disposition: A | Discharge status: Home / Self Care | Payer: Self-pay | Attending: Emergency Medicine | Admitting: Emergency Medicine

## 2018-10-06 ENCOUNTER — Other Ambulatory Visit: Payer: Self-pay

## 2018-10-06 DIAGNOSIS — R69 Illness, unspecified: Secondary | ICD-10-CM

## 2018-10-06 DIAGNOSIS — J111 Influenza due to unidentified influenza virus with other respiratory manifestations: Secondary | ICD-10-CM | POA: Insufficient documentation

## 2018-10-06 DIAGNOSIS — F17210 Nicotine dependence, cigarettes, uncomplicated: Secondary | ICD-10-CM | POA: Insufficient documentation

## 2018-10-06 MED ORDER — LOPERAMIDE HCL 2 MG PO CAPS: 2.0000 mg | ORAL_CAPSULE | Freq: Four times a day (QID) | ORAL | 0 refills | Status: DC | PRN

## 2018-10-06 MED ORDER — ONDANSETRON 8 MG PO TBDP
8.0000 mg | ORAL_TABLET | Freq: Once | ORAL | Status: AC
  Administered 2018-10-06: 8 mg via ORAL
  Filled 2018-10-06: qty 1

## 2018-10-06 MED ORDER — ONDANSETRON HCL 4 MG PO TABS: 4.0000 mg | ORAL_TABLET | Freq: Four times a day (QID) | ORAL | 0 refills | Status: DC

## 2018-10-06 NOTE — ED Triage Notes (Signed)
Pt C/O of body aches and nausea that began yesterday. Pt denies pain.

## 2018-10-06 NOTE — ED Notes (Signed)
Pt ambulatory to waiting room. Pt verbalized understanding of discharge instructions.   

## 2018-10-06 NOTE — ED Provider Notes (Signed)
[facility] EMERGENCY DEPARTMENT Provider Note   CSN: [id number] Arrival date & time: 10/06/18  [id number]     History   Chief Complaint Chief Complaint  Patient presents with  . Generalized Body Aches    HPI Kaylyn Layericholas A Hakanson is a 27 y.o. male.  Patient reports that he became sick yesterday with nausea and vomiting.  He has not been able to eat or drink much.  Vomiting has slowed down because he is not taking anything in, but is still nauseated.  He has had slight loose stools, not quite diarrhea.  There is no associated abdominal pain.  He has not had any fever.  He is unaware of any sick contacts.     Past Medical History:  Diagnosis Date  . Panic attacks     There are no active problems to display for this patient.   History reviewed. No pertinent surgical history.      Home Medications    Prior to Admission medications   Medication Sig Start Date End Date Taking? Authorizing Provider  amoxicillin (AMOXIL) 500 MG capsule Take 1 capsule (500 mg total) by mouth 3 (three) times daily. 07/08/18   Ivery QualeBryant, Hobson, PA-C  dexamethasone (DECADRON) 4 MG tablet Take 1 tablet (4 mg total) by mouth 2 (two) times daily with a meal. 07/08/18   Ivery QualeBryant, Hobson, PA-C  famotidine (PEPCID) 20 MG tablet Take 1-2 tablets (20-40 mg total) by mouth 2 (two) times daily. 07/28/18   Burgess AmorIdol, Julie, PA-C  ibuprofen (ADVIL,MOTRIN) 600 MG tablet Take 1 tablet (600 mg total) by mouth 4 (four) times daily. 07/08/18   Ivery QualeBryant, Hobson, PA-C    Family History Family History  Problem Relation Age of Onset  . Diabetes Other     Social History Social History   Tobacco Use  . Smoking status: Light Tobacco Smoker    Years: 9.00    Types: E-cigarettes  . Smokeless tobacco: Former NeurosurgeonUser    Types: Chew  Substance Use Topics  . Alcohol use: Yes    Alcohol/week: 5.0 standard drinks    Types: 5 Cans of beer per week    Comment: 12 pack per week   . Drug use: No     Allergies   Cinnamon; Chocolate;  and Sunflower oil   Review of Systems Review of Systems  Gastrointestinal: Positive for nausea and vomiting.  All other systems reviewed and are negative.    Physical Exam Updated Vital Signs BP (!) 146/86 (BP Location: Left Arm)   Pulse 77   Temp 97.6 F (36.4 C) (Oral)   Resp 18   Ht 5\' 7"  (1.702 m)   Wt 104.3 kg   SpO2 97%   BMI 36.02 kg/m   Physical Exam Vitals signs and nursing note reviewed.  Constitutional:      General: He is not in acute distress.    Appearance: Normal appearance. He is well-developed.  HENT:     Head: Normocephalic and atraumatic.     Right Ear: Hearing normal.     Left Ear: Hearing normal.     Nose: Nose normal.  Eyes:     Conjunctiva/sclera: Conjunctivae normal.     Pupils: Pupils are equal, round, and reactive to light.  Neck:     Musculoskeletal: Normal range of motion and neck supple.  Cardiovascular:     Rate and Rhythm: Regular rhythm.     Heart sounds: S1 normal and S2 normal. No murmur. No friction rub. No gallop.   Pulmonary:  Effort: Pulmonary effort is normal. No respiratory distress.     Breath sounds: Normal breath sounds.  Chest:     Chest wall: No tenderness.  Abdominal:     General: Bowel sounds are normal.     Palpations: Abdomen is soft.     Tenderness: There is no abdominal tenderness. There is no guarding or rebound. Negative signs include Murphy's sign and McBurney's sign.     Hernia: No hernia is present.  Musculoskeletal: Normal range of motion.  Skin:    General: Skin is warm and dry.     Findings: No rash.  Neurological:     Mental Status: He is alert and oriented to person, place, and time.     GCS: GCS eye subscore is 4. GCS verbal subscore is 5. GCS motor subscore is 6.     Cranial Nerves: No cranial nerve deficit.     Sensory: No sensory deficit.     Coordination: Coordination normal.  Psychiatric:        Speech: Speech normal.        Behavior: Behavior normal.        Thought Content: Thought  content normal.      ED Treatments / Results  Labs (all labs ordered are listed, but only abnormal results are displayed) Labs Reviewed - No data to display  EKG None  Radiology No results found.  Procedures Procedures (including critical care time)  Medications Ordered in ED Medications  ondansetron (ZOFRAN-ODT) disintegrating tablet 8 mg (has no administration in time range)     Initial Impression / Assessment and Plan / ED Course  I have reviewed the triage vital signs and the nursing notes.  Pertinent labs & imaging results that were available during my care of the patient were reviewed by me and considered in my medical decision making (see chart for details).     Patient appears well.  Abdominal exam is benign and nontender.  Patient reports vomiting yesterday with some improvement.  Vital signs are unremarkable.  He does not appear clinically dehydrated.  Nausea and vomiting is likely consistent with the predominant GI illness present in the community currently, will treat symptomatically, given return precautions.  Final Clinical Impressions(s) / ED Diagnoses   Final diagnoses:  Influenza-like illness    ED Discharge Orders    None       Gilda Crease, MD 10/06/18 [phone]

## 2018-11-14 ENCOUNTER — Encounter (HOSPITAL_COMMUNITY): Payer: Self-pay | Admitting: Emergency Medicine

## 2018-11-14 ENCOUNTER — Other Ambulatory Visit: Payer: Self-pay

## 2018-11-14 ENCOUNTER — Emergency Department (HOSPITAL_COMMUNITY)
Admission: EM | Admit: 2018-11-14 | Discharge: 2018-11-14 | Disposition: A | Discharge status: Home / Self Care | Payer: Self-pay | Attending: Emergency Medicine | Admitting: Emergency Medicine

## 2018-11-14 DIAGNOSIS — H6123 Impacted cerumen, bilateral: Secondary | ICD-10-CM | POA: Insufficient documentation

## 2018-11-14 DIAGNOSIS — R21 Rash and other nonspecific skin eruption: Secondary | ICD-10-CM | POA: Insufficient documentation

## 2018-11-14 NOTE — ED Provider Notes (Signed)
[facility] EMERGENCY DEPARTMENT Provider Note   CSN: [id number] Arrival date & time: 11/14/18  1522     History   Chief Complaint Chief Complaint  Patient presents with  . Otalgia  . Skin Problem    HPI Derek White is a 28 y.o. male.  28 y.o male with no PMH presents to the ED with a chief complaint of rash and ear pain x 3 days. Patient reports he was crawling under his house to light a pilot light when he reports something bit him.He endorses a rash to the right lower leg which was itchy and red but has improved in nature. He has applied aloe and states some relieve in symptoms. He also reports some right ear fullness when moving his head. He has a history of ear infection along with cerumen impaction. He denies any loss of hearing, headache, fever, other complaints.      Past Medical History:  Diagnosis Date  . Panic attacks     There are no active problems to display for this patient.   History reviewed. No pertinent surgical history.      Home Medications    Prior to Admission medications   Medication Sig Start Date End Date Taking? Authorizing Provider  amoxicillin (AMOXIL) 500 MG capsule Take 1 capsule (500 mg total) by mouth 3 (three) times daily. 07/08/18   Ivery Quale, PA-C  dexamethasone (DECADRON) 4 MG tablet Take 1 tablet (4 mg total) by mouth 2 (two) times daily with a meal. 07/08/18   Ivery Quale, PA-C  famotidine (PEPCID) 20 MG tablet Take 1-2 tablets (20-40 mg total) by mouth 2 (two) times daily. 07/28/18   Burgess Amor, PA-C  ibuprofen (ADVIL,MOTRIN) 600 MG tablet Take 1 tablet (600 mg total) by mouth 4 (four) times daily. 07/08/18   Ivery Quale, PA-C  loperamide (IMODIUM) 2 MG capsule Take 1 capsule (2 mg total) by mouth 4 (four) times daily as needed for diarrhea or loose stools. 10/06/18   Gilda Crease, MD  ondansetron (ZOFRAN) 4 MG tablet Take 1 tablet (4 mg total) by mouth every 6 (six) hours. 10/06/18   Gilda Crease, MD    Family History Family History  Problem Relation Age of Onset  . Diabetes Other     Social History Social History   Tobacco Use  . Smoking status: Light Tobacco Smoker    Years: 9.00    Types: E-cigarettes  . Smokeless tobacco: Former Neurosurgeon    Types: Chew  Substance Use Topics  . Alcohol use: Yes    Alcohol/week: 5.0 standard drinks    Types: 5 Cans of beer per week    Comment: 12 pack per week   . Drug use: No     Allergies   Cinnamon; Chocolate; and Sunflower oil   Review of Systems Review of Systems  Constitutional: Negative for fever.  HENT: Positive for ear discharge and ear pain. Negative for hearing loss.   Skin: Positive for rash.     Physical Exam Updated Vital Signs BP (!) 144/79 (BP Location: Right Arm)   Pulse 72   Temp 98.2 F (36.8 C) (Temporal)   Ht 5\' 8"  (1.727 m)   Wt 106.6 kg   SpO2 99%   BMI 35.73 kg/m   Physical Exam Vitals signs and nursing note reviewed.  Constitutional:      Appearance: He is well-developed.  HENT:     Head: Normocephalic and atraumatic.     Right Ear: Hearing  normal. There is impacted cerumen. Tympanic membrane is erythematous. Tympanic membrane is not injected.     Left Ear: Hearing normal. There is impacted cerumen. Tympanic membrane is erythematous. Tympanic membrane is not injected.     Ears:     Comments: Erythematous TM BL, significant amount of cerumen to BL ears, no decrease in hearing.  Eyes:     General: No scleral icterus.    Pupils: Pupils are equal, round, and reactive to light.  Neck:     Musculoskeletal: Normal range of motion.  Cardiovascular:     Heart sounds: Normal heart sounds.  Pulmonary:     Effort: Pulmonary effort is normal.     Breath sounds: Normal breath sounds. No wheezing.  Chest:     Chest wall: No tenderness.  Abdominal:     General: Bowel sounds are normal. There is no distension.     Palpations: Abdomen is soft.     Tenderness: There is no abdominal tenderness.    Musculoskeletal:        General: No tenderness or deformity.  Skin:    General: Skin is warm and dry.     Findings: Rash present. No erythema. Rash is urticarial.          Comments: Urticarial rash to the lower right extremity. No erythema surrounding the wound.   Neurological:     Mental Status: He is alert and oriented to person, place, and time.      ED Treatments / Results  Labs (all labs ordered are listed, but only abnormal results are displayed) Labs Reviewed - No data to display  EKG None  Radiology No results found.  Procedures Procedures (including critical care time)  Medications Ordered in ED Medications - No data to display   Initial Impression / Assessment and Plan / ED Course  I have reviewed the triage vital signs and the nursing notes.  Pertinent labs & imaging results that were available during my care of the patient were reviewed by me and considered in my medical decision making (see chart for details).    No past medical history presents to the ED with ear fullness along with a rash on his leg.  Rash appears urticarial in nature and started when patient was crawling under his house to turn a pilot light on.  No blistering, fevers, red flags for this rash.  Patient is advised to apply Neosporin or bacitracin to the area.  Bilateral ears are full of cerumen, right worse than left.  I myself attempted to irrigate both the ears without much success in removing wax.  Patient is requesting an ENT referral as he has recurrent cerumen impactions.  At this time I will give him a referral for Dr. Jearld Fenton in [facility].  He is advised to follow-up with them.  His hearing is normal as he reports.  Return precautions provided at length for patient.   Final Clinical Impressions(s) / ED Diagnoses   Final diagnoses:  Bilateral impacted cerumen    ED Discharge Orders    None       Claude Manges, PA-C 11/14/18 1759    Loren Racer, MD 11/16/18  2306

## 2018-11-14 NOTE — Discharge Instructions (Addendum)
I have provided a referral to an ENT specialist, please schedule an appointment with them. I have provided peroxide for you if you would like to try irrigating your ears at home.

## 2018-11-14 NOTE — ED Triage Notes (Signed)
Pt c/o abrasion to L lower calf and R ear pain. States when he lays on that side he "can feel something moving in it".

## 2018-11-17 ENCOUNTER — Other Ambulatory Visit: Payer: Self-pay

## 2018-11-17 ENCOUNTER — Encounter (HOSPITAL_COMMUNITY): Payer: Self-pay

## 2018-11-17 ENCOUNTER — Emergency Department (HOSPITAL_COMMUNITY)
Admission: EM | Admit: 2018-11-17 | Discharge: 2018-11-17 | Disposition: A | Discharge status: Home / Self Care | Payer: Self-pay | Attending: Emergency Medicine | Admitting: Emergency Medicine

## 2018-11-17 ENCOUNTER — Emergency Department (HOSPITAL_COMMUNITY): Payer: Self-pay

## 2018-11-17 DIAGNOSIS — Z79899 Other long term (current) drug therapy: Secondary | ICD-10-CM | POA: Insufficient documentation

## 2018-11-17 DIAGNOSIS — R1013 Epigastric pain: Secondary | ICD-10-CM | POA: Insufficient documentation

## 2018-11-17 DIAGNOSIS — R1010 Upper abdominal pain, unspecified: Secondary | ICD-10-CM

## 2018-11-17 DIAGNOSIS — F17290 Nicotine dependence, other tobacco product, uncomplicated: Secondary | ICD-10-CM | POA: Insufficient documentation

## 2018-11-17 DIAGNOSIS — F17220 Nicotine dependence, chewing tobacco, uncomplicated: Secondary | ICD-10-CM | POA: Insufficient documentation

## 2018-11-17 LAB — CBC WITH DIFFERENTIAL/PLATELET
Abs Immature Granulocytes: 0.04 10*3/uL (ref 0.00–0.07)
BASOS ABS: 0 10*3/uL (ref 0.0–0.1)
Basophils Relative: 1 %
EOS ABS: 0.1 10*3/uL (ref 0.0–0.5)
Eosinophils Relative: 1 %
HCT: 45.9 % (ref 39.0–52.0)
Hemoglobin: 14.5 g/dL (ref 13.0–17.0)
Immature Granulocytes: 1 %
Lymphocytes Relative: 32 %
Lymphs Abs: 2.3 10*3/uL (ref 0.7–4.0)
MCH: 28.9 pg (ref 26.0–34.0)
MCHC: 31.6 g/dL (ref 30.0–36.0)
MCV: 91.4 fL (ref 80.0–100.0)
MONOS PCT: 10 %
Monocytes Absolute: 0.7 10*3/uL (ref 0.1–1.0)
NRBC: 0 % (ref 0.0–0.2)
Neutro Abs: 4.1 10*3/uL (ref 1.7–7.7)
Neutrophils Relative %: 55 %
Platelets: 197 10*3/uL (ref 150–400)
RBC: 5.02 MIL/uL (ref 4.22–5.81)
RDW: 12.5 % (ref 11.5–15.5)
WBC: 7.3 10*3/uL (ref 4.0–10.5)

## 2018-11-17 LAB — COMPREHENSIVE METABOLIC PANEL
ALT: 58 U/L — ABNORMAL HIGH (ref 0–44)
AST: 29 U/L (ref 15–41)
Albumin: 3.9 g/dL (ref 3.5–5.0)
Alkaline Phosphatase: 50 U/L (ref 38–126)
Anion gap: 5 (ref 5–15)
BILIRUBIN TOTAL: 0.3 mg/dL (ref 0.3–1.2)
BUN: 19 mg/dL (ref 6–20)
CO2: 27 mmol/L (ref 22–32)
Calcium: 8.8 mg/dL — ABNORMAL LOW (ref 8.9–10.3)
Chloride: 107 mmol/L (ref 98–111)
Creatinine, Ser: 1.03 mg/dL (ref 0.61–1.24)
GFR calc Af Amer: >60 mL/min (ref >60)
GFR calc non Af Amer: >60 mL/min (ref >60)
GLUCOSE: 104 mg/dL — AB (ref 70–99)
Potassium: 3.8 mmol/L (ref 3.5–5.1)
Sodium: 139 mmol/L (ref 135–145)
Total Protein: 6.4 g/dL — ABNORMAL LOW (ref 6.5–8.1)

## 2018-11-17 LAB — LIPASE, BLOOD: Lipase: 32 U/L (ref 11–51)

## 2018-11-17 MED ORDER — FAMOTIDINE 20 MG PO TABS: 20.0000 mg | ORAL_TABLET | Freq: Two times a day (BID) | ORAL | 0 refills | Status: DC

## 2018-11-17 MED ORDER — DICYCLOMINE HCL 20 MG PO TABS: ORAL_TABLET | ORAL | 0 refills | Status: DC

## 2018-11-17 NOTE — ED Notes (Signed)
Patient refused x-ray and Dr. Estell Harpin notified. X-ray aware.

## 2018-11-17 NOTE — Discharge Instructions (Addendum)
Follow up next week if not improving. °

## 2018-11-17 NOTE — ED Triage Notes (Signed)
Pt states he feels like he was shot by a cannon ball in his stomach that started today. Denies any other symptoms.

## 2018-11-17 NOTE — ED Provider Notes (Signed)
[facility] Provider Note   CSN: [id number] Arrival date & time: 11/17/18  2028     History   Chief Complaint Chief Complaint  Patient presents with  . Abdominal Pain    HPI Derek White is a 28 y.o. male.  Patient complains of upper abdominal pain and periumbilical pain for couple days.  The pain is cramping and has improved now  The history is provided by the patient. No language interpreter was used.  Abdominal Pain  Pain location:  Epigastric Pain quality: aching   Pain radiates to:  Does not radiate Pain severity:  Mild Onset quality:  Gradual Timing:  Intermittent Progression:  Waxing and waning Chronicity:  New Context: not alcohol use   Relieved by:  Nothing Worsened by:  Nothing Associated symptoms: no chest pain, no cough, no diarrhea, no fatigue and no hematuria     Past Medical History:  Diagnosis Date  . Panic attacks     There are no active problems to display for this patient.   History reviewed. No pertinent surgical history.      Home Medications    Prior to Admission medications   Medication Sig Start Date End Date Taking? Authorizing Provider  Aspirin-Acetaminophen-Caffeine (GOODY HEADACHE PO) Take 1 packet by mouth daily as needed (for severe headache pain).   Yes [provider]  Multiple Vitamin (MULTIVITAMIN WITH MINERALS) TABS tablet Take 1 tablet by mouth daily.   Yes [provider]  Omega-3 Fatty Acids (FISH OIL) 500 MG CAPS Take 1 capsule by mouth daily.   Yes [provider]  dicyclomine (BENTYL) 20 MG tablet Take 1 pill every 6-8 hours if needed for abdominal cramping 11/17/18   Bethann BerkshireZammit, Belanna Manring, MD  famotidine (PEPCID) 20 MG tablet Take 1 tablet (20 mg total) by mouth 2 (two) times daily. 11/17/18   Bethann BerkshireZammit, Randye Treichler, MD    Family History Family History  Problem Relation Age of Onset  . Diabetes Other     Social History Social History   Tobacco Use  . Smoking status:  Light Tobacco Smoker    Years: 9.00    Types: E-cigarettes  . Smokeless tobacco: Current User    Types: Chew  Substance Use Topics  . Alcohol use: Yes    Alcohol/week: 5.0 standard drinks    Types: 5 Cans of beer per week    Comment: 12 pack per week   . Drug use: No     Allergies   Cinnamon; Chocolate; and Sunflower oil   Review of Systems Review of Systems  Constitutional: Negative for appetite change and fatigue.  HENT: Negative for congestion, ear discharge and sinus pressure.   Eyes: Negative for discharge.  Respiratory: Negative for cough.   Cardiovascular: Negative for chest pain.  Gastrointestinal: Positive for abdominal pain. Negative for diarrhea.  Genitourinary: Negative for frequency and hematuria.  Musculoskeletal: Negative for back pain.  Skin: Negative for rash.  Neurological: Negative for seizures and headaches.  Psychiatric/Behavioral: Negative for hallucinations.     Physical Exam Updated Vital Signs BP 130/75 (BP Location: Left Arm)   Pulse 62   Temp 98.2 F (36.8 C) (Oral)   Resp 16   Ht 5\' 7"  (1.702 m)   Wt 108.9 kg   SpO2 100%   BMI 37.59 kg/m   Physical Exam Vitals signs and nursing note reviewed.  Constitutional:      Appearance: He is well-developed.  HENT:     Head: Normocephalic.  Nose: Nose normal.  Eyes:     General: No scleral icterus.    Conjunctiva/sclera: Conjunctivae normal.  Neck:     Musculoskeletal: Neck supple.     Thyroid: No thyromegaly.  Cardiovascular:     Rate and Rhythm: Normal rate and regular rhythm.     Heart sounds: No murmur. No friction rub. No gallop.   Pulmonary:     Breath sounds: No stridor. No wheezing or rales.  Chest:     Chest wall: No tenderness.  Abdominal:     General: There is no distension.     Tenderness: There is abdominal tenderness. There is no rebound.  Musculoskeletal: Normal range of motion.  Lymphadenopathy:     Cervical: No cervical adenopathy.  Skin:    Findings: No  erythema or rash.  Neurological:     Mental Status: He is oriented to person, place, and time.     Motor: No abnormal muscle tone.     Coordination: Coordination normal.  Psychiatric:        Behavior: Behavior normal.      ED Treatments / Results  Labs (all labs ordered are listed, but only abnormal results are displayed) Labs Reviewed  COMPREHENSIVE METABOLIC PANEL - Abnormal; Notable for the following components:      Result Value   Glucose, Bld 104 (*)    Calcium 8.8 (*)    Total Protein 6.4 (*)    ALT 58 (*)    All other components within normal limits  CBC WITH DIFFERENTIAL/PLATELET  LIPASE, BLOOD    EKG None  Radiology No results found.  Procedures Procedures (including critical care time)  Medications Ordered in ED Medications - No data to display   Initial Impression / Assessment and Plan / ED Course  I have reviewed the triage vital signs and the nursing notes.  Pertinent labs & imaging results that were available during my care of the patient were reviewed by me and considered in my medical decision making (see chart for details).     Labs unremarkable.  Patient refused x-rays.  Patient having epigastric and periumbilical discomfort.  He will be placed on Pepcid and Bentyl and will follow-up next week for recheck.  Final Clinical Impressions(s) / ED Diagnoses   Final diagnoses:  Pain of upper abdomen    ED Discharge Orders         Ordered    famotidine (PEPCID) 20 MG tablet  2 times daily     11/17/18 2306    dicyclomine (BENTYL) 20 MG tablet     11/17/18 2306           Bethann Berkshire, MD 11/17/18 2311

## 2018-12-01 ENCOUNTER — Other Ambulatory Visit: Payer: Self-pay

## 2018-12-01 ENCOUNTER — Emergency Department (HOSPITAL_COMMUNITY)
Admission: EM | Admit: 2018-12-01 | Discharge: 2018-12-01 | Disposition: A | Discharge status: Home / Self Care | Payer: Self-pay | Attending: Emergency Medicine | Admitting: Emergency Medicine

## 2018-12-01 ENCOUNTER — Encounter (HOSPITAL_COMMUNITY): Payer: Self-pay | Admitting: *Deleted

## 2018-12-01 DIAGNOSIS — R300 Dysuria: Secondary | ICD-10-CM | POA: Insufficient documentation

## 2018-12-01 DIAGNOSIS — R319 Hematuria, unspecified: Secondary | ICD-10-CM | POA: Insufficient documentation

## 2018-12-01 DIAGNOSIS — F17290 Nicotine dependence, other tobacco product, uncomplicated: Secondary | ICD-10-CM | POA: Insufficient documentation

## 2018-12-01 DIAGNOSIS — R109 Unspecified abdominal pain: Secondary | ICD-10-CM | POA: Insufficient documentation

## 2018-12-01 DIAGNOSIS — M545 Low back pain: Secondary | ICD-10-CM | POA: Insufficient documentation

## 2018-12-01 DIAGNOSIS — Z79899 Other long term (current) drug therapy: Secondary | ICD-10-CM | POA: Insufficient documentation

## 2018-12-01 LAB — URINALYSIS, ROUTINE W REFLEX MICROSCOPIC
BACTERIA UA: NONE SEEN
Bilirubin Urine: NEGATIVE
Glucose, UA: NEGATIVE mg/dL
Ketones, ur: NEGATIVE mg/dL
Leukocytes,Ua: NEGATIVE
Nitrite: NEGATIVE
PH: 5 (ref 5.0–8.0)
Protein, ur: NEGATIVE mg/dL
Specific Gravity, Urine: 1.027 (ref 1.005–1.030)

## 2018-12-01 MED ORDER — NAPROXEN 500 MG PO TABS: 500.0000 mg | ORAL_TABLET | Freq: Two times a day (BID) | ORAL | 0 refills | Status: DC

## 2018-12-01 NOTE — ED Provider Notes (Signed)
[facility] EMERGENCY DEPARTMENT Provider Note   CSN: [id number] Arrival date & time: 12/01/18  2021     History   Chief Complaint Chief Complaint  Patient presents with  . Dysuria    HPI Derek White is a 28 y.o. male.  HPI Patient presented to the emergency room for evaluation of left flank pain.  She stated he had acute onset of severe pain earlier in the day today.  It was in the left flank area.  The pain was also in his lower back and he noticed some burning when he urinated.  He was not sure if there was any blood in his urine.  Patient denies any history of kidney stones but he is worried he may have had a kidney stone.  His pain is better now with only a 3 out of 10.  He denies any fevers or chills or any nausea or vomiting. Past Medical History:  Diagnosis Date  . Panic attacks     There are no active problems to display for this patient.   History reviewed. No pertinent surgical history.      Home Medications    Prior to Admission medications   Medication Sig Start Date End Date Taking? Authorizing Provider  Aspirin-Acetaminophen-Caffeine (GOODY HEADACHE PO) Take 1 packet by mouth daily as needed (for severe headache pain).    [provider]  dicyclomine (BENTYL) 20 MG tablet Take 1 pill every 6-8 hours if needed for abdominal cramping 11/17/18   Bethann Berkshire, MD  famotidine (PEPCID) 20 MG tablet Take 1 tablet (20 mg total) by mouth 2 (two) times daily. 11/17/18   Bethann Berkshire, MD  Multiple Vitamin (MULTIVITAMIN WITH MINERALS) TABS tablet Take 1 tablet by mouth daily.    [provider]  Omega-3 Fatty Acids (FISH OIL) 500 MG CAPS Take 1 capsule by mouth daily.    [provider]    Family History Family History  Problem Relation Age of Onset  . Diabetes Other     Social History Social History   Tobacco Use  . Smoking status: Light Tobacco Smoker    Years: 9.00    Types: E-cigarettes  . Smokeless tobacco: Current  User    Types: Chew  Substance Use Topics  . Alcohol use: Yes    Alcohol/week: 5.0 standard drinks    Types: 5 Cans of beer per week    Comment: 12 pack per week   . Drug use: No     Allergies   Cinnamon; Chocolate; and Sunflower oil   Review of Systems Review of Systems  All other systems reviewed and are negative.    Physical Exam Updated Vital Signs BP 123/66 (BP Location: Right Arm)   Pulse 99   Temp (!) 97 F (36.1 C) (Temporal)   Resp 18   Ht 1.702 m (5\' 7" )   Wt 108.8 kg   SpO2 99%   BMI 37.57 kg/m   Physical Exam Vitals signs and nursing note reviewed.  Constitutional:      General: He is not in acute distress.    Appearance: He is well-developed.  HENT:     Head: Normocephalic and atraumatic.     Right Ear: External ear normal.     Left Ear: External ear normal.  Eyes:     General: No scleral icterus.       Right eye: No discharge.        Left eye: No discharge.     Conjunctiva/sclera: Conjunctivae  normal.  Neck:     Musculoskeletal: Neck supple.     Trachea: No tracheal deviation.  Cardiovascular:     Rate and Rhythm: Normal rate and regular rhythm.  Pulmonary:     Effort: Pulmonary effort is normal. No respiratory distress.     Breath sounds: Normal breath sounds. No stridor. No wheezing or rales.  Abdominal:     General: Bowel sounds are normal. There is no distension.     Palpations: Abdomen is soft.     Tenderness: There is no abdominal tenderness. There is no guarding or rebound.  Musculoskeletal:        General: No tenderness.  Skin:    General: Skin is warm and dry.     Findings: No rash.  Neurological:     Mental Status: He is alert.     Cranial Nerves: No cranial nerve deficit (no facial droop, extraocular movements intact, no slurred speech).     Sensory: No sensory deficit.     Motor: No abnormal muscle tone or seizure activity.     Coordination: Coordination normal.      ED Treatments / Results  Labs (all labs ordered  are listed, but only abnormal results are displayed) Labs Reviewed  URINALYSIS, ROUTINE W REFLEX MICROSCOPIC     Procedures Procedures (including critical care time)  Medications Ordered in ED Medications - No data to display   Initial Impression / Assessment and Plan / ED Course  I have reviewed the triage vital signs and the nursing notes.  Pertinent labs & imaging results that were available during my care of the patient were reviewed by me and considered in my medical decision making (see chart for details).  Clinical Course as of Dec 02 2151  Tue Dec 01, 2018  2152 Discussed treatment evaluation with the patient.  He would prefer not to have blood test.  He is concerned about the cost.  He did have a normal blood test he states that his doctor not too long ago.  Patient would just like to have a urine test.   [JK]    Clinical Course User Index [JK] Linwood DibblesKnapp, Hadar Elgersma, MD    Pt appears comfortable at this time.  Will check urine as requested.  Dr Pilar PlateBero will follow up on the result.  Final Clinical Impressions(s) / ED Diagnoses   Final diagnoses:  Flank pain    ED Discharge Orders    None       Linwood DibblesKnapp, Rickia Freeburg, MD 12/01/18 2153

## 2018-12-01 NOTE — ED Provider Notes (Signed)
  Provider Note MRN:  [id number]  Arrival date & time: 12/01/18    ED Course and Medical Decision Making  I received sign out for this patient at shift change from Dr. Lynelle Doctor.  Urinalysis without evidence of infection, small blood.  Given flank pain and history of kidney stones, suspect recurrence of the same.  Patient again defers laboratory testing or imaging today.  Pain is well controlled, will take ibuprofen at home.  Work note provided.  After the discussed management above, the patient was determined to be safe for discharge.  The patient was in agreement with this plan and all questions regarding their care were answered.  ED return precautions were discussed and the patient will return to the ED with any significant worsening of condition.  Elmer Sow. Pilar Plate, MD [facility] Emergency Medicine [facility] [email or web address]    Sabas Sous, MD 12/01/18 [phone]

## 2018-12-01 NOTE — Discharge Instructions (Addendum)
Take the medications as needed for pain.  Follow up with a primary care doctor if the symptoms persist,   return for fever, vomiting, worsening symptoms

## 2018-12-01 NOTE — ED Triage Notes (Signed)
Pt c/o lower back pain and abdominal pain and states today when he urinated he had some burning with urination

## 2018-12-11 ENCOUNTER — Encounter (HOSPITAL_COMMUNITY): Payer: Self-pay

## 2018-12-11 ENCOUNTER — Other Ambulatory Visit: Payer: Self-pay

## 2018-12-11 ENCOUNTER — Emergency Department (HOSPITAL_COMMUNITY)
Admission: EM | Admit: 2018-12-11 | Discharge: 2018-12-11 | Disposition: A | Discharge status: Home / Self Care | Payer: Self-pay | Attending: Emergency Medicine | Admitting: Emergency Medicine

## 2018-12-11 DIAGNOSIS — Y939 Activity, unspecified: Secondary | ICD-10-CM | POA: Insufficient documentation

## 2018-12-11 DIAGNOSIS — Y929 Unspecified place or not applicable: Secondary | ICD-10-CM | POA: Insufficient documentation

## 2018-12-11 DIAGNOSIS — Y999 Unspecified external cause status: Secondary | ICD-10-CM | POA: Insufficient documentation

## 2018-12-11 DIAGNOSIS — F17290 Nicotine dependence, other tobacco product, uncomplicated: Secondary | ICD-10-CM | POA: Insufficient documentation

## 2018-12-11 DIAGNOSIS — X509XXA Other and unspecified overexertion or strenuous movements or postures, initial encounter: Secondary | ICD-10-CM | POA: Insufficient documentation

## 2018-12-11 DIAGNOSIS — S29012A Strain of muscle and tendon of back wall of thorax, initial encounter: Secondary | ICD-10-CM | POA: Insufficient documentation

## 2018-12-11 DIAGNOSIS — S161XXA Strain of muscle, fascia and tendon at neck level, initial encounter: Secondary | ICD-10-CM | POA: Insufficient documentation

## 2018-12-11 MED ORDER — IBUPROFEN 800 MG PO TABS
800.0000 mg | ORAL_TABLET | Freq: Once | ORAL | Status: AC
  Administered 2018-12-11: 800 mg via ORAL
  Filled 2018-12-11: qty 1

## 2018-12-11 MED ORDER — DICLOFENAC SODIUM 75 MG PO TBEC: 75.0000 mg | DELAYED_RELEASE_TABLET | Freq: Two times a day (BID) | ORAL | 0 refills | Status: DC

## 2018-12-11 MED ORDER — CYCLOBENZAPRINE HCL 10 MG PO TABS
10.0000 mg | ORAL_TABLET | Freq: Once | ORAL | Status: AC
  Administered 2018-12-11: 10 mg via ORAL
  Filled 2018-12-11: qty 1

## 2018-12-11 MED ORDER — CYCLOBENZAPRINE HCL 10 MG PO TABS: 10.0000 mg | ORAL_TABLET | Freq: Three times a day (TID) | ORAL | 0 refills | Status: DC | PRN

## 2018-12-11 NOTE — ED Triage Notes (Signed)
Pt reports upper back pain that started 2 days ago. Pt says he wasn't "doing anything" and it "just started hurting". Pt denies injury.

## 2018-12-11 NOTE — Discharge Instructions (Addendum)
Try alternating ice and heat to your upper back and neck.  Avoid looking up or picking up anything greater than 20 pounds for 5 to 7 days.  Do not take aspirin, Aleve, or ibuprofen while taking the prescription diclofenac.  Return to the ER for any worsening symptoms

## 2018-12-14 ENCOUNTER — Encounter (HOSPITAL_COMMUNITY): Payer: Self-pay | Admitting: Emergency Medicine

## 2018-12-14 ENCOUNTER — Other Ambulatory Visit: Payer: Self-pay

## 2018-12-14 ENCOUNTER — Emergency Department (HOSPITAL_COMMUNITY)
Admission: EM | Admit: 2018-12-14 | Discharge: 2018-12-14 | Disposition: A | Discharge status: Home / Self Care | Payer: Self-pay | Attending: Emergency Medicine | Admitting: Emergency Medicine

## 2018-12-14 DIAGNOSIS — Z79899 Other long term (current) drug therapy: Secondary | ICD-10-CM | POA: Insufficient documentation

## 2018-12-14 DIAGNOSIS — Y999 Unspecified external cause status: Secondary | ICD-10-CM | POA: Insufficient documentation

## 2018-12-14 DIAGNOSIS — S39012A Strain of muscle, fascia and tendon of lower back, initial encounter: Secondary | ICD-10-CM | POA: Insufficient documentation

## 2018-12-14 DIAGNOSIS — Y939 Activity, unspecified: Secondary | ICD-10-CM | POA: Insufficient documentation

## 2018-12-14 DIAGNOSIS — F17290 Nicotine dependence, other tobacco product, uncomplicated: Secondary | ICD-10-CM | POA: Insufficient documentation

## 2018-12-14 DIAGNOSIS — Y929 Unspecified place or not applicable: Secondary | ICD-10-CM | POA: Insufficient documentation

## 2018-12-14 DIAGNOSIS — X58XXXA Exposure to other specified factors, initial encounter: Secondary | ICD-10-CM | POA: Insufficient documentation

## 2018-12-14 DIAGNOSIS — T148XXA Other injury of unspecified body region, initial encounter: Secondary | ICD-10-CM

## 2018-12-14 NOTE — Discharge Instructions (Addendum)
Return if any problems.

## 2018-12-14 NOTE — ED Triage Notes (Signed)
Pt reports pain to upper back that began 12/08/2018. Pt was seen for the same on 12/11/2018. Pt states the pain is better but "still hurts."

## 2018-12-14 NOTE — ED Provider Notes (Signed)
[facility] EMERGENCY DEPARTMENT Provider Note   CSN: [id number] Arrival date & time: 12/14/18  1909    History   Chief Complaint Chief Complaint  Patient presents with  . Back Pain    HPI Derek White is a 28 y.o. male.     The history is provided by the patient. No language interpreter was used.  Back Pain  Location:  Lumbar spine Quality:  Aching Radiates to:  Does not radiate Pain severity:  Mild Progression:  Improving Chronicity:  New Relieved by:  Nothing Worsened by:  Nothing Ineffective treatments:  None tried Associated symptoms: no numbness, no paresthesias and no weakness   Pt here for recheck.  He reports he feels better.  Pt request note for work for light duty for the next week,   Past Medical History:  Diagnosis Date  . Panic attacks     There are no active problems to display for this patient.   History reviewed. No pertinent surgical history.      Home Medications    Prior to Admission medications   Medication Sig Start Date End Date Taking? Authorizing Provider  Aspirin-Acetaminophen-Caffeine (GOODY HEADACHE PO) Take 1 packet by mouth daily as needed (for severe headache pain).    [provider]  cyclobenzaprine (FLEXERIL) 10 MG tablet Take 1 tablet (10 mg total) by mouth 3 (three) times daily as needed. 12/11/18   Triplett, Tammy, PA-C  diclofenac (VOLTAREN) 75 MG EC tablet Take 1 tablet (75 mg total) by mouth 2 (two) times daily. Take with food 12/11/18   Triplett, Tammy, PA-C  dicyclomine (BENTYL) 20 MG tablet Take 1 pill every 6-8 hours if needed for abdominal cramping Patient taking differently: Take 20 mg by mouth every 6 (six) hours as needed for spasms. Take 1 pill every 6-8 hours if needed for abdominal cramping 11/17/18   Bethann Berkshire, MD  famotidine (PEPCID) 20 MG tablet Take 1 tablet (20 mg total) by mouth 2 (two) times daily. Patient not taking: Reported on 12/01/2018 11/17/18   Bethann Berkshire, MD  Multiple  Vitamin (MULTIVITAMIN WITH MINERALS) TABS tablet Take 1 tablet by mouth daily.    [provider]  Omega-3 Fatty Acids (FISH OIL) 500 MG CAPS Take 1 capsule by mouth daily.    [provider]    Family History Family History  Problem Relation Age of Onset  . Diabetes Other     Social History Social History   Tobacco Use  . Smoking status: Light Tobacco Smoker    Years: 9.00    Types: E-cigarettes  . Smokeless tobacco: Current User    Types: Chew  Substance Use Topics  . Alcohol use: Yes    Alcohol/week: 5.0 standard drinks    Types: 5 Cans of beer per week    Comment: 12 pack per week   . Drug use: No     Allergies   Cinnamon; Chocolate; and Sunflower oil   Review of Systems Review of Systems  Musculoskeletal: Positive for back pain.  Neurological: Negative for weakness, numbness and paresthesias.  All other systems reviewed and are negative.    Physical Exam Updated Vital Signs BP 119/77 (BP Location: Right Arm)   Pulse 60   Temp 98.6 F (37 C)   Resp 16   Wt 111.1 kg   SpO2 98%   BMI 35.15 kg/m   Physical Exam Vitals signs and nursing note reviewed.  Constitutional:      Appearance: He is well-developed.  HENT:  Head: Normocephalic.  Neck:     Musculoskeletal: Normal range of motion.  Cardiovascular:     Rate and Rhythm: Normal rate.     Pulses: Normal pulses.  Pulmonary:     Effort: Pulmonary effort is normal.  Abdominal:     General: Abdomen is flat. There is no distension.  Musculoskeletal: Normal range of motion.  Skin:    General: Skin is warm.  Neurological:     Mental Status: He is alert and oriented to person, place, and time.  Psychiatric:        Mood and Affect: Mood normal.      ED Treatments / Results  Labs (all labs ordered are listed, but only abnormal results are displayed) Labs Reviewed - No data to display  EKG None  Radiology No results found.  Procedures Procedures (including critical  care time)  Medications Ordered in ED Medications - No data to display   Initial Impression / Assessment and Plan / ED Course  I have reviewed the triage vital signs and the nursing notes.  Pertinent labs & imaging results that were available during my care of the patient were reviewed by me and considered in my medical decision making (see chart for details).        MDM   Pt advised to continue current medication.  Pt given note for modified work for 1 week.   Final Clinical Impressions(s) / ED Diagnoses   Final diagnoses:  Muscle strain    ED Discharge Orders    None    An After Visit Summary was printed and given to the patient.    Osie Cheeks 12/14/18 2330    Bethann Berkshire, MD 12/16/18 [phone]

## 2018-12-14 NOTE — ED Provider Notes (Signed)
[facility] EMERGENCY DEPARTMENT Provider Note   CSN: [id number] Arrival date & time: 12/11/18  1756    History   Chief Complaint Chief Complaint  Patient presents with  . Back Pain    HPI Derek White is a 28 y.o. male.     HPI   Derek White is a 28 y.o. male who presents to the Emergency Department complaining of pain of his upper back for 2 days.  He describes a constant aching pain around both upper shoulder blades  that is worse with movement and improves at rest.  He denies known injury.  He also states the pain radiates into the left side of his neck.  He denies headache, dizziness, visual changes, nausea or vomiting, and pain numbness or weakness radiating into his upper extremities.  Past Medical History:  Diagnosis Date  . Panic attacks     There are no active problems to display for this patient.   History reviewed. No pertinent surgical history.    Home Medications    Prior to Admission medications   Medication Sig Start Date End Date Taking? Authorizing Provider  Aspirin-Acetaminophen-Caffeine (GOODY HEADACHE PO) Take 1 packet by mouth daily as needed (for severe headache pain).    [provider]  cyclobenzaprine (FLEXERIL) 10 MG tablet Take 1 tablet (10 mg total) by mouth 3 (three) times daily as needed. 12/11/18   Albino Bufford, PA-C  diclofenac (VOLTAREN) 75 MG EC tablet Take 1 tablet (75 mg total) by mouth 2 (two) times daily. Take with food 12/11/18   Tarry Fountain, PA-C  dicyclomine (BENTYL) 20 MG tablet Take 1 pill every 6-8 hours if needed for abdominal cramping Patient taking differently: Take 20 mg by mouth every 6 (six) hours as needed for spasms. Take 1 pill every 6-8 hours if needed for abdominal cramping 11/17/18   Bethann Berkshire, MD  famotidine (PEPCID) 20 MG tablet Take 1 tablet (20 mg total) by mouth 2 (two) times daily. Patient not taking: Reported on 12/01/2018 11/17/18   Bethann Berkshire, MD  Multiple Vitamin  (MULTIVITAMIN WITH MINERALS) TABS tablet Take 1 tablet by mouth daily.    [provider]  Omega-3 Fatty Acids (FISH OIL) 500 MG CAPS Take 1 capsule by mouth daily.    [provider]    Family History Family History  Problem Relation Age of Onset  . Diabetes Other     Social History Social History   Tobacco Use  . Smoking status: Light Tobacco Smoker    Years: 9.00    Types: E-cigarettes  . Smokeless tobacco: Current User    Types: Chew  Substance Use Topics  . Alcohol use: Yes    Alcohol/week: 5.0 standard drinks    Types: 5 Cans of beer per week    Comment: 12 pack per week   . Drug use: No     Allergies   Cinnamon; Chocolate; and Sunflower oil   Review of Systems Review of Systems  Constitutional: Negative for chills and fever.  Eyes: Negative for visual disturbance.  Respiratory: Negative for shortness of breath.   Cardiovascular: Negative for chest pain and leg swelling.  Gastrointestinal: Negative for abdominal pain, constipation and vomiting.  Genitourinary: Negative for decreased urine volume, difficulty urinating, dysuria, flank pain and hematuria.  Musculoskeletal: Positive for back pain and neck pain. Negative for arthralgias and joint swelling.  Skin: Negative for color change, rash and wound.  Neurological: Negative for dizziness, weakness, numbness and headaches.  Physical Exam Updated Vital Signs BP (!) 151/89 (BP Location: Right Arm)   Pulse 65   Temp 97.6 F (36.4 C) (Oral)   Resp 16   Ht 5\' 10"  (1.778 m)   Wt 111.1 kg   SpO2 99%   BMI 35.15 kg/m   Physical Exam Vitals signs and nursing note reviewed.  Constitutional:      General: He is not in acute distress.    Appearance: Normal appearance. He is well-developed.  HENT:     Head: Atraumatic.  Neck:     Musculoskeletal: Normal range of motion. Muscular tenderness present.     Comments: Mild tenderness to palpation of the left cervical paraspinal muscles and  left trapezius muscle.  No bony tenderness or step-offs. Cardiovascular:     Rate and Rhythm: Normal rate and regular rhythm.     Pulses: Normal pulses.     Comments: DP pulses are strong and palpable bilaterally Pulmonary:     Effort: Pulmonary effort is normal. No respiratory distress.     Breath sounds: Normal breath sounds.  Abdominal:     General: There is no distension.     Palpations: Abdomen is soft.     Tenderness: There is no abdominal tenderness.  Musculoskeletal:        General: Tenderness present. No swelling or deformity.     Lumbar back: He exhibits tenderness and pain. He exhibits normal range of motion, no swelling, no deformity, no laceration and normal pulse.     Comments: Mild tenderness to palpation along the bilateral rhomboid muscles and left trapezius muscle.  No spinal tenderness.  No motor weakness of the bilateral upper extremities.  Skin:    General: Skin is warm and dry.     Capillary Refill: Capillary refill takes less than 2 seconds.     Findings: No rash.  Neurological:     General: No focal deficit present.     Mental Status: He is alert.     GCS: GCS eye subscore is 4. GCS verbal subscore is 5. GCS motor subscore is 6.     Sensory: Sensation is intact. No sensory deficit.     Motor: No weakness or abnormal muscle tone.     Coordination: Coordination is intact.     Gait: Gait is intact. Gait normal.     Deep Tendon Reflexes:     Reflex Scores:      Patellar reflexes are 2+ on the right side and 2+ on the left side.      Achilles reflexes are 2+ on the right side and 2+ on the left side.    Comments: CN II-XII grossly intact.      ED Treatments / Results  Labs (all labs ordered are listed, but only abnormal results are displayed) Labs Reviewed - No data to display  EKG None  Radiology No results found.   Procedures Procedures (including critical care time)  Medications Ordered in ED Medications  cyclobenzaprine (FLEXERIL) tablet 10  mg (10 mg Oral Given 12/11/18 2123)  ibuprofen (ADVIL,MOTRIN) tablet 800 mg (800 mg Oral Given 12/11/18 2123)     Initial Impression / Assessment and Plan / ED Course  I have reviewed the triage vital signs and the nursing notes.  Pertinent labs & imaging results that were available during my care of the patient were reviewed by me and considered in my medical decision making (see chart for details).        Patient is well-appearing.  Neurovascularly intact.  No focal neuro deficits on exam.  Pain is felt to be musculoskeletal.  He is reassured.  Agrees to symptomatic treatment, ice, and close outpatient follow-up.  Return precautions were discussed.  Final Clinical Impressions(s) / ED Diagnoses   Final diagnoses:  Acute strain of neck muscle, initial encounter  Muscle strain of left upper back, initial encounter  Muscle strain of right upper back, initial encounter    ED Discharge Orders         Ordered    cyclobenzaprine (FLEXERIL) 10 MG tablet  3 times daily PRN     12/11/18 2111    diclofenac (VOLTAREN) 75 MG EC tablet  2 times daily     12/11/18 2111           Pauline Aus, PA-C 12/14/18 1831    Donnetta Hutching, MD 12/16/18 [phone]

## 2018-12-28 ENCOUNTER — Other Ambulatory Visit: Payer: Self-pay

## 2018-12-28 ENCOUNTER — Encounter (HOSPITAL_COMMUNITY): Payer: Self-pay | Admitting: Emergency Medicine

## 2018-12-28 ENCOUNTER — Emergency Department (HOSPITAL_COMMUNITY)
Admission: EM | Admit: 2018-12-28 | Discharge: 2018-12-28 | Disposition: A | Discharge status: Home / Self Care | Payer: Self-pay | Attending: Emergency Medicine | Admitting: Emergency Medicine

## 2018-12-28 DIAGNOSIS — J069 Acute upper respiratory infection, unspecified: Secondary | ICD-10-CM | POA: Insufficient documentation

## 2018-12-28 DIAGNOSIS — F17220 Nicotine dependence, chewing tobacco, uncomplicated: Secondary | ICD-10-CM | POA: Insufficient documentation

## 2018-12-28 DIAGNOSIS — F17290 Nicotine dependence, other tobacco product, uncomplicated: Secondary | ICD-10-CM | POA: Insufficient documentation

## 2018-12-28 DIAGNOSIS — Z0279 Encounter for issue of other medical certificate: Secondary | ICD-10-CM | POA: Insufficient documentation

## 2018-12-28 NOTE — ED Provider Notes (Signed)
[facility] EMERGENCY DEPARTMENT Provider Note   CSN: [id number] Arrival date & time: 12/28/18  1572    History   Chief Complaint Chief Complaint  Patient presents with  . Cough    HPI Derek White is a 28 y.o. male.     Patient presents with recurrent cough for 1 week.  Patient was seen by clinician 1 week ago.  No fevers or chills.  Patient said he actually feels better.  Patient needs a note to return to work tomorrow.     Past Medical History:  Diagnosis Date  . Panic attacks     There are no active problems to display for this patient.   History reviewed. No pertinent surgical history.      Home Medications    Prior to Admission medications   Medication Sig Start Date End Date Taking? Authorizing Provider  Aspirin-Acetaminophen-Caffeine (GOODY HEADACHE PO) Take 1 packet by mouth daily as needed (for severe headache pain).    [provider]  cyclobenzaprine (FLEXERIL) 10 MG tablet Take 1 tablet (10 mg total) by mouth 3 (three) times daily as needed. 12/11/18   Triplett, Tammy, PA-C  diclofenac (VOLTAREN) 75 MG EC tablet Take 1 tablet (75 mg total) by mouth 2 (two) times daily. Take with food 12/11/18   Triplett, Tammy, PA-C  dicyclomine (BENTYL) 20 MG tablet Take 1 pill every 6-8 hours if needed for abdominal cramping Patient taking differently: Take 20 mg by mouth every 6 (six) hours as needed for spasms. Take 1 pill every 6-8 hours if needed for abdominal cramping 11/17/18   Bethann Berkshire, MD  famotidine (PEPCID) 20 MG tablet Take 1 tablet (20 mg total) by mouth 2 (two) times daily. Patient not taking: Reported on 12/01/2018 11/17/18   Bethann Berkshire, MD  Multiple Vitamin (MULTIVITAMIN WITH MINERALS) TABS tablet Take 1 tablet by mouth daily.    [provider]  Omega-3 Fatty Acids (FISH OIL) 500 MG CAPS Take 1 capsule by mouth daily.    [provider]    Family History Family History  Problem Relation Age of Onset  .  Diabetes Other     Social History Social History   Tobacco Use  . Smoking status: Light Tobacco Smoker    Years: 9.00    Types: E-cigarettes  . Smokeless tobacco: Current User    Types: Chew  Substance Use Topics  . Alcohol use: Yes    Alcohol/week: 5.0 standard drinks    Types: 5 Cans of beer per week    Comment: 12 pack per week   . Drug use: No     Allergies   Cinnamon; Chocolate; and Sunflower oil   Review of Systems Review of Systems  Constitutional: Negative for chills and fever.  HENT: Positive for congestion.   Respiratory: Positive for cough. Negative for shortness of breath.   Cardiovascular: Negative for chest pain.  Gastrointestinal: Negative for abdominal pain and vomiting.  Genitourinary: Negative for dysuria and flank pain.  Musculoskeletal: Negative for back pain, neck pain and neck stiffness.  Skin: Negative for rash.  Neurological: Negative for headaches.     Physical Exam Updated Vital Signs BP (!) 144/94 (BP Location: Right Arm)   Pulse 68   Temp 98.1 F (36.7 C) (Oral)   Resp 16   Ht 5\' 10"  (1.778 m)   Wt 106.6 kg   SpO2 99%   BMI 33.72 kg/m   Physical Exam Vitals signs and nursing note reviewed.  Constitutional:  Appearance: He is well-developed.  HENT:     Head: Normocephalic and atraumatic.  Eyes:     General:        Right eye: No discharge.        Left eye: No discharge.     Conjunctiva/sclera: Conjunctivae normal.  Neck:     Musculoskeletal: Normal range of motion.     Trachea: No tracheal deviation.  Cardiovascular:     Rate and Rhythm: Normal rate.  Pulmonary:     Effort: Pulmonary effort is normal.     Breath sounds: Normal breath sounds.  Abdominal:     General: There is no distension.     Palpations: Abdomen is soft.     Tenderness: There is no abdominal tenderness. There is no guarding.  Skin:    General: Skin is warm.  Neurological:     Mental Status: He is alert and oriented to person, place, and time.       ED Treatments / Results  Labs (all labs ordered are listed, but only abnormal results are displayed) Labs Reviewed - No data to display  EKG None  Radiology No results found.  Procedures Procedures (including critical care time)  Medications Ordered in ED Medications - No data to display   Initial Impression / Assessment and Plan / ED Course  I have reviewed the triage vital signs and the nursing notes.  Pertinent labs & imaging results that were available during my care of the patient were reviewed by me and considered in my medical decision making (see chart for details).       Patient well-appearing presents with clinically upper respiratory infection.  Normal work of breathing, lungs clear.  Discussed supportive care outpatient follow-up and a work note.  Final Clinical Impressions(s) / ED Diagnoses   Final diagnoses:  Acute upper respiratory infection    ED Discharge Orders    None       Blane Ohara, MD 12/28/18 [phone]

## 2018-12-28 NOTE — Discharge Instructions (Addendum)
Stay well-hydrated with water, use tea and honey as needed for cough. You can return to work tomorrow assuming you do not have a fever.

## 2018-12-28 NOTE — ED Triage Notes (Signed)
Patient complaining of cough x 1 week. States he was seen here for same last week. States now coughing up "tiny bit of blood."

## 2018-12-28 NOTE — ED Notes (Signed)
Patient states he was seen here on 12/22/18 and is requesting note for work for that date. Advised patient our records do not show he was seen here on that date so we are unable to give work note. Patient given work note for today. Patient states "I can't go back to work unless I have that note from the 3rd." Questioned patient if he went to another facility, patient states "I don't know, I thought I came here."

## 2019-01-06 ENCOUNTER — Emergency Department (HOSPITAL_COMMUNITY)
Admission: EM | Admit: 2019-01-06 | Discharge: 2019-01-06 | Disposition: A | Discharge status: Home / Self Care | Payer: Self-pay | Attending: Emergency Medicine | Admitting: Emergency Medicine

## 2019-01-06 ENCOUNTER — Other Ambulatory Visit: Payer: Self-pay

## 2019-01-06 ENCOUNTER — Encounter (HOSPITAL_COMMUNITY): Payer: Self-pay | Admitting: Emergency Medicine

## 2019-01-06 DIAGNOSIS — Z8619 Personal history of other infectious and parasitic diseases: Secondary | ICD-10-CM | POA: Insufficient documentation

## 2019-01-06 DIAGNOSIS — R05 Cough: Secondary | ICD-10-CM | POA: Insufficient documentation

## 2019-01-06 DIAGNOSIS — R197 Diarrhea, unspecified: Secondary | ICD-10-CM | POA: Insufficient documentation

## 2019-01-06 DIAGNOSIS — R0981 Nasal congestion: Secondary | ICD-10-CM | POA: Insufficient documentation

## 2019-01-06 DIAGNOSIS — R112 Nausea with vomiting, unspecified: Secondary | ICD-10-CM | POA: Insufficient documentation

## 2019-01-06 NOTE — ED Triage Notes (Signed)
Pt states he has been out of work since the 9th, needs a work note. States he has had one episode of N/ and D/ today. Was given a prescription and has not taken the medication.

## 2019-01-06 NOTE — ED Notes (Signed)
Pt remains in restroom and unable to triage.

## 2019-01-06 NOTE — Discharge Instructions (Addendum)
Medically cleared to return back to work.  Return for any new or worse symptoms.

## 2019-01-06 NOTE — ED Notes (Signed)
Pt states he didn't have money to afford medications prescribed at last visit.  Given good rx card and educated pt and how to use.

## 2019-01-06 NOTE — ED Provider Notes (Signed)
[facility] EMERGENCY DEPARTMENT Provider Note   CSN: [id number] Arrival date & time: 01/06/19  1720    History   Chief Complaint Chief Complaint  Patient presents with  . Follow-up    HPI Derek White is a 28 y.o. male.     Patient seen March 9 in the emergency department had a one-week history of upper respiratory type infection at that time.  Patient was evaluated given him some prescriptions for symptomatic treatment.  Given a work note to return to work on March 10.  Patient's request at that time was that he needed a work note to return to work.  Patient now states that he is continued to have some upper respiratory symptoms never really got better but does feel well enough to go back to work and wants a note stating that he can return to work and that he was out of work from March 9 - March 18.  Patient denies any significant cough or fevers.  Patient states that he has still has some loose bowel movements.  Did have vomiting but it has resolved.  Illness has continued since 1 week prior to March 9.  Patient without any travel exposures or any known exposures to coronavirus.     Past Medical History:  Diagnosis Date  . Panic attacks     There are no active problems to display for this patient.   History reviewed. No pertinent surgical history.      Home Medications    Prior to Admission medications   Medication Sig Start Date End Date Taking? Authorizing Provider  Aspirin-Acetaminophen-Caffeine (GOODY HEADACHE PO) Take 1 packet by mouth daily as needed (for severe headache pain).    [provider]  cyclobenzaprine (FLEXERIL) 10 MG tablet Take 1 tablet (10 mg total) by mouth 3 (three) times daily as needed. 12/11/18   Triplett, Tammy, PA-C  diclofenac (VOLTAREN) 75 MG EC tablet Take 1 tablet (75 mg total) by mouth 2 (two) times daily. Take with food 12/11/18   Triplett, Tammy, PA-C  dicyclomine (BENTYL) 20 MG tablet Take 1 pill every 6-8 hours if  needed for abdominal cramping Patient taking differently: Take 20 mg by mouth every 6 (six) hours as needed for spasms. Take 1 pill every 6-8 hours if needed for abdominal cramping 11/17/18   Bethann BerkshireZammit, Joseph, MD  famotidine (PEPCID) 20 MG tablet Take 1 tablet (20 mg total) by mouth 2 (two) times daily. Patient not taking: Reported on 12/01/2018 11/17/18   Bethann BerkshireZammit, Joseph, MD  Multiple Vitamin (MULTIVITAMIN WITH MINERALS) TABS tablet Take 1 tablet by mouth daily.    [provider]  Omega-3 Fatty Acids (FISH OIL) 500 MG CAPS Take 1 capsule by mouth daily.    [provider]    Family History Family History  Problem Relation Age of Onset  . Diabetes Other     Social History Social History   Tobacco Use  . Smoking status: Light Tobacco Smoker    Years: 9.00    Types: E-cigarettes  . Smokeless tobacco: Current User    Types: Chew  Substance Use Topics  . Alcohol use: Yes    Alcohol/week: 5.0 standard drinks    Types: 5 Cans of beer per week    Comment: 12 pack per week   . Drug use: No     Allergies   Cinnamon; Chocolate; and Sunflower oil   Review of Systems Review of Systems  Constitutional: Negative for chills and fever.  HENT: Positive  for congestion. Negative for rhinorrhea and sore throat.   Eyes: Negative for visual disturbance.  Respiratory: Positive for cough. Negative for shortness of breath.   Cardiovascular: Negative for chest pain and leg swelling.  Gastrointestinal: Positive for diarrhea, nausea and vomiting. Negative for abdominal pain.  Genitourinary: Negative for dysuria.  Musculoskeletal: Negative for back pain and neck pain.  Skin: Negative for rash.  Neurological: Negative for dizziness, light-headedness and headaches.  Hematological: Does not bruise/bleed easily.  Psychiatric/Behavioral: Negative for confusion.     Physical Exam Updated Vital Signs BP (!) 149/93 (BP Location: Right Arm)   Pulse 82   Temp 98.1 F (36.7 C) (Oral)    Resp 14   Ht 1.778 m (5\' 10" )   Wt 106.6 kg   SpO2 98%   BMI 33.72 kg/m   Physical Exam Vitals signs and nursing note reviewed.  Constitutional:      Appearance: He is well-developed.  HENT:     Head: Normocephalic and atraumatic.     Mouth/Throat:     Mouth: Mucous membranes are moist.  Eyes:     Extraocular Movements: Extraocular movements intact.     Conjunctiva/sclera: Conjunctivae normal.     Pupils: Pupils are equal, round, and reactive to light.  Neck:     Musculoskeletal: Normal range of motion and neck supple.  Cardiovascular:     Rate and Rhythm: Normal rate and regular rhythm.     Heart sounds: Normal heart sounds. No murmur.  Pulmonary:     Effort: Pulmonary effort is normal. No respiratory distress.     Breath sounds: Normal breath sounds. No wheezing, rhonchi or rales.  Abdominal:     General: Bowel sounds are normal.     Palpations: Abdomen is soft.     Tenderness: There is no abdominal tenderness.  Musculoskeletal: Normal range of motion.  Skin:    General: Skin is warm and dry.     Capillary Refill: Capillary refill takes less than 2 seconds.  Neurological:     General: No focal deficit present.     Mental Status: He is alert and oriented to person, place, and time.      ED Treatments / Results  Labs (all labs ordered are listed, but only abnormal results are displayed) Labs Reviewed - No data to display  EKG None  Radiology No results found.  Procedures Procedures (including critical care time)  Medications Ordered in ED Medications - No data to display   Initial Impression / Assessment and Plan / ED Course  I have reviewed the triage vital signs and the nursing notes.  Pertinent labs & imaging results that were available during my care of the patient were reviewed by me and considered in my medical decision making (see chart for details).        Work-up without any significant findings.  Vital signs of any significant  abnormalities.  Patient without any persistent cough lungs are clear bilaterally.  Abdomen soft and nontender.  Patient can return to work today or tomorrow.  Unable to give patient a work note forward from March 9 to March 18.  Actually when he was seen on March 9 I gave him a work note to return to work on March 10 patient did not follow-up with anybody.  Patient nontoxic no acute distress.  His illness has been ongoing since the end of February but now improved.  No significant coronavirus exposures or known contacts.  Final Clinical Impressions(s) / ED Diagnoses  Final diagnoses:  Hx of viral illness    ED Discharge Orders    None       Vanetta Mulders, MD 01/06/19 [id number]

## 2019-04-15 ENCOUNTER — Other Ambulatory Visit: Payer: Self-pay

## 2019-04-15 ENCOUNTER — Emergency Department (HOSPITAL_COMMUNITY)
Admission: EM | Admit: 2019-04-15 | Discharge: 2019-04-15 | Disposition: A | Discharge status: Home / Self Care | Payer: Self-pay | Attending: Emergency Medicine | Admitting: Emergency Medicine

## 2019-04-15 ENCOUNTER — Encounter (HOSPITAL_COMMUNITY): Payer: Self-pay | Admitting: Emergency Medicine

## 2019-04-15 DIAGNOSIS — F17290 Nicotine dependence, other tobacco product, uncomplicated: Secondary | ICD-10-CM | POA: Insufficient documentation

## 2019-04-15 DIAGNOSIS — H938X3 Other specified disorders of ear, bilateral: Secondary | ICD-10-CM | POA: Insufficient documentation

## 2019-04-15 DIAGNOSIS — Z79899 Other long term (current) drug therapy: Secondary | ICD-10-CM | POA: Insufficient documentation

## 2019-04-15 MED ORDER — FLUTICASONE PROPIONATE 50 MCG/ACT NA SUSP: 1.0000 | Freq: Every day | NASAL | 0 refills | Status: DC

## 2019-04-15 NOTE — ED Triage Notes (Signed)
Patient reports worsening pressure in his ears. Onset 1 month ago.

## 2019-04-15 NOTE — ED Provider Notes (Signed)
[facility]ERGENC[facility] DEPARTMENT Provider Note   CSN: [id number] Arrival date & time: 04/15/19  1551     History   Chief Complaint Chief Complaint  Patient presents with  . Ear Fullness    HPI Derek White is a 28 y.o. male with a history of light tobacco use and panic attacks who presents to the emergency department with complaints of bilateral ear pressure for the past 1 month.  States his ears feel clogged with a pressure sensation, not necessarily painful.  Symptoms are constant, progressively worsening.  No alleviating or aggravating factors.  No intervention prior to arrival.  Denies fever, chills, drainage from the ear, hearing loss, or nasal congestion.      HPI  Past Medical History:  Diagnosis Date  . Panic attacks     There are no active problems to display for this patient.   History reviewed. No pertinent surgical history.      Home Medications    Prior to Admission medications   Medication Sig Start Date End Date Taking? Authorizing Provider  Aspirin-Acetaminophen-Caffeine (GOODY HEADACHE PO) Take 1 packet by mouth daily as needed (for severe headache pain).    [provider]  cyclobenzaprine (FLEXERIL) 10 MG tablet Take 1 tablet (10 mg total) by mouth 3 (three) times daily as needed. 12/11/18   Triplett, Tammy, PA-C  diclofenac (VOLTAREN) 75 MG EC tablet Take 1 tablet (75 mg total) by mouth 2 (two) times daily. Take with food 12/11/18   Triplett, Tammy, PA-C  dicyclomine (BENTYL) 20 MG tablet Take 1 pill every 6-8 hours if needed for abdominal cramping Patient taking differently: Take 20 mg by mouth every 6 (six) hours as needed for spasms. Take 1 pill every 6-8 hours if needed for abdominal cramping 11/17/18   Bethann BerkshireZammit, Joseph, MD  famotidine (PEPCID) 20 MG tablet Take 1 tablet (20 mg total) by mouth 2 (two) times daily. Patient not taking: Reported on 12/01/2018 11/17/18   Bethann BerkshireZammit, Joseph, MD  Multiple Vitamin (MULTIVITAMIN WITH MINERALS) TABS  tablet Take 1 tablet by mouth daily.    [provider]  Omega-3 Fatty Acids (FISH OIL) 500 MG CAPS Take 1 capsule by mouth daily.    [provider]    Family History Family History  Problem Relation Age of Onset  . Diabetes Other     Social History Social History   Tobacco Use  . Smoking status: Light Tobacco Smoker    Years: 9.00    Types: E-cigarettes  . Smokeless tobacco: Current User    Types: Chew  Substance Use Topics  . Alcohol use: Yes    Alcohol/week: 5.0 standard drinks    Types: 5 Cans of beer per week    Comment: 12 pack per week   . Drug use: Yes    Types: Marijuana    Comment: 2 weeks ago     Allergies   Cinnamon, Chocolate, and Sunflower oil   Review of Systems Review of Systems  Constitutional: Negative for chills and fever.  HENT: Negative for congestion, ear discharge, ear pain, hearing loss, nosebleeds, rhinorrhea, sinus pressure, sinus pain, sore throat, trouble swallowing and voice change.        + for ear pressure bilaterally.   Respiratory: Negative for cough and shortness of breath.     Physical Exam Updated Vital Signs BP (!) 141/84 (BP Location: Right Arm)   Pulse 61   Temp 98.1 F (36.7 C) (Oral)   Resp 16   Ht 5'  8" (1.727 m)   Wt 106 kg   SpO2 100%   BMI 35.53 kg/m   Physical Exam Vitals signs and nursing note reviewed.  Constitutional:      General: He is not in acute distress.    Appearance: He is well-developed.  HENT:     Head: Normocephalic and atraumatic.     Right Ear: Ear canal and external ear normal. No decreased hearing noted. No drainage, swelling or tenderness. There is no impacted cerumen.     Left Ear: Ear canal and external ear normal. No decreased hearing noted. No drainage, swelling or tenderness. There is no impacted cerumen.     Ears:     Comments: Bilateral TMs appear somewhat full w/ mild amount of fluid noted.  No erythema, perforation, retraction, or bulging noted.  No mastoid  erythema, swelling, or tenderness.    Nose:     Right Sinus: No maxillary sinus tenderness or frontal sinus tenderness.     Left Sinus: No maxillary sinus tenderness or frontal sinus tenderness.     Comments: Mild nasal congestion with somewhat boggy turbinates.    Mouth/Throat:     Mouth: Mucous membranes are moist.     Pharynx: Uvula midline. No pharyngeal swelling, oropharyngeal exudate or posterior oropharyngeal erythema.  Eyes:     General:        Right eye: No discharge.        Left eye: No discharge.     Conjunctiva/sclera: Conjunctivae normal.  Neck:     Musculoskeletal: Normal range of motion and neck supple. No neck rigidity.  Cardiovascular:     Rate and Rhythm: Normal rate and regular rhythm.  Pulmonary:     Effort: Pulmonary effort is normal.     Breath sounds: Normal breath sounds.  Lymphadenopathy:     Cervical: No cervical adenopathy.  Neurological:     Mental Status: He is alert.     Comments: Clear speech.   Psychiatric:        Behavior: Behavior normal.        Thought Content: Thought content normal.      ED Treatments / Results  Labs (all labs ordered are listed, but only abnormal results are displayed) Labs Reviewed - No data to display  EKG    Radiology No results found.  Procedures Procedures (including critical care time)  Medications Ordered in ED Medications - No data to display   Initial Impression / Assessment and Plan / ED Course  I have reviewed the triage vital signs and the nursing notes.  Pertinent labs & imaging results that were available during my care of the patient were reviewed by me and considered in my medical decision making (see chart for details).   Patient presents to the ED w/ complaints of bilateral ear pressure/clogged sensation x 1 month. Nontoxic appearing, vitals WNL other than mildly elevated BP- doubt HTN emergency. No cerumen impaction present. Exam not consistent w/ AOE, AOM, or mastoiditis. TMs w/ some  fluid & fullness, nasal congestion w/ boggy turbinates- suspect some degree of eustachian tube dysfunction. Trial flonase. PCP follow up. I discussed treatment plan, need for follow-up, and return precautions with the patient. Provided opportunity for questions, patient confirmed understanding and is in agreement with plan.   Final Clinical Impressions(s) / ED Diagnoses   Final diagnoses:  Sensation of fullness in both ears    ED Discharge Orders         Ordered    fluticasone (FLONASE) 50  MCG/ACT nasal spray  Daily     04/15/19 [address] Glynda Jaeger, PA-C 04/15/19 1618    Nat Christen, MD 04/19/19 1544

## 2019-04-15 NOTE — Discharge Instructions (Addendum)
You were seen in the emergency department today for ear fullness sensation.  Your exam does not appear consistent with infection or wax buildup in the ear.  We would like you to try using Flonase, 1 spray in each nostril daily.   We have prescribed you new medication(s) today. Discuss the medications prescribed today with your pharmacist as they can have adverse effects and interactions with your other medicines including over the counter and prescribed medications. Seek medical evaluation if you start to experience new or abnormal symptoms after taking one of these medicines, seek care immediately if you start to experience difficulty breathing, feeling of your throat closing, facial swelling, or rash as these could be indications of a more serious allergic reaction  Please follow-up with primary care provider within 1 week for reevaluation.  Return to the ER for new or worsening symptoms  Additionally have your blood pressure rechecked by primary care as it was somewhat elevated in the ER today. Vitals:   04/15/19 1601  BP: (!) 141/84  Pulse: 61  Resp: 16  Temp: 98.1 F (36.7 C)  SpO2: 100%     [facility] Doctor List    Sinda Du MD. Specialty: Pulmonary Disease Contact information: [place]  [place] [address]  [phone]   Tula Nakayama, MD. Specialty: [facility] Medicine Contact information: [address], [address] [place] [address]  [phone]   Sallee Lange, MD. Specialty: [facility] Medicine Contact information: [address]  [place] [address]  [phone]   Rosita Fire, MD Specialty: Internal Medicine Contact information: [place] [address]  [phone]   Delphina Cahill, MD. Specialty: Internal Medicine Contact information: [place] [address]  [phone]    [facility] (Dr. Maudie Mercury) Specialty: Family Medicine Contact information: [place] [address]  [phone]   Leslie Andrea, MD. Specialty: [facility] Medicine Contact information: [place] [address]  [phone]   Asencion Noble, MD. Specialty: Internal Medicine Contact information: [address]  [place] [address]  [place]  [address] [place], [place] [address] [phone]  Services [facility] offers a variety of basic health services.  Services include but are not limited to: Blood pressure checks  Heart rate checks  Blood sugar checks  Urine analysis  Rapid strep tests  Pregnancy tests.  Health education and referrals  People needing more complex services will be directed to a physician online. Using these virtual visits, doctors can evaluate and prescribe medicine and treatments. There will be no medication on-site, though [facility] will help patients fill their prescriptions at little to no cost.   For More information please go to: [email or web address]

## 2019-06-18 ENCOUNTER — Emergency Department (HOSPITAL_COMMUNITY)
Admission: EM | Admit: 2019-06-18 | Discharge: 2019-06-18 | Disposition: A | Discharge status: Home / Self Care | Payer: Self-pay | Attending: Emergency Medicine | Admitting: Emergency Medicine

## 2019-06-18 ENCOUNTER — Other Ambulatory Visit: Payer: Self-pay

## 2019-06-18 NOTE — ED Notes (Signed)
No answer in waiting room 

## 2019-06-18 NOTE — ED Notes (Signed)
No answer in triage.

## 2019-06-18 NOTE — ED Triage Notes (Signed)
Patient called x 3 for Triage. No answer, not present in waiting area.

## 2019-07-04 ENCOUNTER — Emergency Department (HOSPITAL_COMMUNITY)
Admission: EM | Admit: 2019-07-04 | Discharge: 2019-07-04 | Disposition: A | Discharge status: Home / Self Care | Payer: Self-pay | Attending: Emergency Medicine | Admitting: Emergency Medicine

## 2019-07-04 ENCOUNTER — Other Ambulatory Visit: Payer: Self-pay

## 2019-07-04 ENCOUNTER — Encounter (HOSPITAL_COMMUNITY): Payer: Self-pay | Admitting: Emergency Medicine

## 2019-07-04 DIAGNOSIS — F17290 Nicotine dependence, other tobacco product, uncomplicated: Secondary | ICD-10-CM | POA: Insufficient documentation

## 2019-07-04 DIAGNOSIS — H6983 Other specified disorders of Eustachian tube, bilateral: Secondary | ICD-10-CM | POA: Insufficient documentation

## 2019-07-04 DIAGNOSIS — Z79899 Other long term (current) drug therapy: Secondary | ICD-10-CM | POA: Insufficient documentation

## 2019-07-04 DIAGNOSIS — J3489 Other specified disorders of nose and nasal sinuses: Secondary | ICD-10-CM | POA: Insufficient documentation

## 2019-07-04 HISTORY — DX: Gastro-esophageal reflux disease without esophagitis: K21.9

## 2019-07-04 MED ORDER — FLUTICASONE PROPIONATE 50 MCG/ACT NA SUSP: 1.0000 | Freq: Every day | NASAL | 0 refills | Status: DC

## 2019-07-04 MED ORDER — CETIRIZINE HCL 10 MG PO TABS: 10.0000 mg | ORAL_TABLET | Freq: Every day | ORAL | 0 refills | Status: DC

## 2019-07-04 NOTE — ED Notes (Signed)
PA in to assess 

## 2019-07-04 NOTE — ED Provider Notes (Signed)
[facility] Provider Note   CSN: [id number] Arrival date & time: 07/04/19  [id number]     History   Chief Complaint Chief Complaint  Patient presents with  . Otalgia    HPI Derek White is a 28 y.o. male with history of GERD and panic attacks presents today for bilateral otalgia x3 months.  Patient reports that he has had a pressure-like sensation in both of his ears, worse left compared to right x3 months a mild sensation constant without aggravating factors, temporarily relieved by "popping his ears" by holding his nose blowing air.  He has not tried any medications for his symptoms or sought primary care evaluation.  He denies any radiation of his symptoms.  Patient denies fever/chills, headache/vision changes, neck pain/stiffness, difficulty swallowing, voice change, chest pain/shortness of breath, cough, nausea/vomiting, recent illness or any additional concerns.     HPI  Past Medical History:  Diagnosis Date  . GERD (gastroesophageal reflux disease)   . Panic attacks     There are no active problems to display for this patient.   History reviewed. No pertinent surgical history.      Home Medications    Prior to Admission medications   Medication Sig Start Date End Date Taking? Authorizing Provider  Aspirin-Acetaminophen-Caffeine (GOODY HEADACHE PO) Take 1 packet by mouth daily as needed (for severe headache pain).    [provider]  cetirizine (ZYRTEC) 10 MG tablet Take 1 tablet (10 mg total) by mouth daily. 07/04/19 08/03/19  Harlene SaltsMorelli, Tyjae Shvartsman A, PA-C  cyclobenzaprine (FLEXERIL) 10 MG tablet Take 1 tablet (10 mg total) by mouth 3 (three) times daily as needed. 12/11/18   Triplett, Tammy, PA-C  diclofenac (VOLTAREN) 75 MG EC tablet Take 1 tablet (75 mg total) by mouth 2 (two) times daily. Take with food 12/11/18   Triplett, Tammy, PA-C  dicyclomine (BENTYL) 20 MG tablet Take 1 pill every 6-8 hours if needed for abdominal cramping  Patient taking differently: Take 20 mg by mouth every 6 (six) hours as needed for spasms. Take 1 pill every 6-8 hours if needed for abdominal cramping 11/17/18   Bethann BerkshireZammit, Joseph, MD  famotidine (PEPCID) 20 MG tablet Take 1 tablet (20 mg total) by mouth 2 (two) times daily. Patient not taking: Reported on 12/01/2018 11/17/18   Bethann BerkshireZammit, Joseph, MD  fluticasone [facility](FLONASE) 50 MCG/ACT nasal spray Place 1 spray into both nostrils daily. 07/04/19 08/03/19  Harlene SaltsMorelli, Tequlia Gonsalves A, PA-C  Multiple Vitamin (MULTIVITAMIN WITH MINERALS) TABS tablet Take 1 tablet by mouth daily.    [provider]  Omega-3 Fatty Acids (FISH OIL) 500 MG CAPS Take 1 capsule by mouth daily.    [provider]    Family History Family History  Problem Relation Age of Onset  . Diabetes Other     Social History Social History   Tobacco Use  . Smoking status: Light Tobacco Smoker    Years: 9.00    Types: E-cigarettes  . Smokeless tobacco: Current User    Types: Chew  Substance Use Topics  . Alcohol use: Yes    Alcohol/week: 5.0 standard drinks    Types: 5 Cans of beer per week    Comment: 12 pack per week   . Drug use: Yes    Types: Marijuana    Comment: 2 weeks ago     Allergies   Cinnamon, Chocolate, and Sunflower oil   Review of Systems Review of Systems Ten systems are reviewed and are negative for acute change  except as noted in the HPI   Physical Exam Updated Vital Signs BP (!) 153/95 (BP Location: Right Arm)   Pulse 95   Temp 98.2 F (36.8 C) (Oral)   Resp 16   Ht 5\' 7"  (1.702 m)   Wt 102.1 kg   SpO2 99%   BMI 35.24 kg/m   Physical Exam Constitutional:      General: He is not in acute distress.    Appearance: Normal appearance. He is well-developed. He is obese. He is not ill-appearing or diaphoretic.  HENT:     Head: Normocephalic and atraumatic. No raccoon eyes or Battle's sign.     Jaw: There is normal jaw occlusion.     Right Ear: Ear canal and external ear normal. A middle  ear effusion is present. There is no impacted cerumen. No foreign body. No hemotympanum. Tympanic membrane is not erythematous.     Left Ear: Ear canal and external ear normal. A middle ear effusion is present. There is no impacted cerumen. No foreign body. No hemotympanum. Tympanic membrane is not erythematous.     Ears:     Comments: Minimal effusions of the bilateral TMs without evidence of infection.    Nose: Rhinorrhea present. Rhinorrhea is clear.     Right Nostril: No epistaxis.     Left Nostril: No epistaxis.     Right Sinus: No maxillary sinus tenderness or frontal sinus tenderness.     Left Sinus: No maxillary sinus tenderness or frontal sinus tenderness.     Mouth/Throat:     Mouth: Mucous membranes are moist.     Pharynx: Oropharynx is clear.  Eyes:     General: Vision grossly intact. Gaze aligned appropriately.     Extraocular Movements: Extraocular movements intact.     Conjunctiva/sclera: Conjunctivae normal.     Pupils: Pupils are equal, round, and reactive to light.     Comments: No pain with extraocular motion  Neck:     Musculoskeletal: Full passive range of motion without pain, normal range of motion and neck supple.     Trachea: Trachea and phonation normal. No tracheal tenderness or tracheal deviation.  Pulmonary:     Effort: Pulmonary effort is normal. No respiratory distress.  Abdominal:     General: There is no distension.     Palpations: Abdomen is soft.     Tenderness: There is no abdominal tenderness. There is no guarding or rebound.  Musculoskeletal: Normal range of motion.  Skin:    General: Skin is warm and dry.  Neurological:     Mental Status: He is alert.     GCS: GCS eye subscore is 4. GCS verbal subscore is 5. GCS motor subscore is 6.     Comments: Speech is clear and goal oriented, follows commands Major Cranial nerves without deficit, no facial droop Moves extremities without ataxia, coordination intact  Psychiatric:        Behavior: Behavior  normal.     ED Treatments / Results  Labs (all labs ordered are listed, but only abnormal results are displayed) Labs Reviewed - No data to display  EKG None  Radiology No results found.  Procedures Procedures (including critical care time)  Medications Ordered in ED Medications - No data to display   Initial Impression / Assessment and Plan / ED Course  I have reviewed the triage vital signs and the nursing notes.  Pertinent labs & imaging results that were available during my care of the patient were reviewed by  me and considered in my medical decision making (see chart for details).    28 year old male presents today for pressure and popping of his bilateral ears x3 months.  He has some mild clear rhinorrhea on examination, his mild bilateral middle ear effusions without evidence of infection.  No sign of otitis, sinusitis, pharyngitis, mastoiditis, dental abscess, RPA/PTA, TMJ or other emergent pathologies at this time.  Appears as patient may have some mild allergies causing eustachian tube dysfunction.  No indication for antibiotics or imaging at this time.  Will treat with Flonase and antihistamines, patient is to follow-up with his primary care provider this week for reevaluation.  At this time there does not appear to be any evidence of an acute emergency medical condition and the patient appears stable for discharge with appropriate outpatient follow up. Diagnosis was discussed with patient who verbalizes understanding of care plan and is agreeable to discharge. I have discussed return precautions with patient who verbalizes understanding of return precautions. Patient encouraged to follow-up with their PCP. All questions answered.  Patient has been discharged in good condition.   Note: Portions of this report may have been transcribed using voice recognition software. Every effort was made to ensure accuracy; however, inadvertent computerized transcription errors may still  be present. Final Clinical Impressions(s) / ED Diagnoses   Final diagnoses:  Eustachian tube dysfunction, bilateral  Rhinorrhea    ED Discharge Orders         Ordered    cetirizine (ZYRTEC) 10 MG tablet  Daily     07/04/19 1149    fluticasone (FLONASE) 50 MCG/ACT nasal spray  Daily     07/04/19 1149           Deliah Boston, PA-C 07/04/19 Kenney, [place], DO 07/06/19 1104

## 2019-07-04 NOTE — ED Notes (Signed)
Several month history of pressure to pt ears- primarily the L ear   Has not followed nor sought eval in the last several months  Reports worse last night so here for eval

## 2019-07-04 NOTE — ED Triage Notes (Signed)
Patient c/o bilateral ear pain x3 months. Denies any drainage. Denies any fevers.

## 2019-07-04 NOTE — ED Notes (Signed)
Pt upon discharge requested and received a work note

## 2019-07-04 NOTE — Discharge Instructions (Addendum)
You have been diagnosed today with eustachian tube dysfunction, rhinorrhea.  At this time there does not appear to be the presence of an emergent medical condition, however there is always the potential for conditions to change. Please read and follow the below instructions.  Please return to the Emergency Department immediately for any new or worsening symptoms. Please be sure to follow up with your Primary Care Provider within one week regarding your visit today; please call their office to schedule an appointment even if you are feeling better for a follow-up visit. You may use the allergy medications Zyrtec and Flonase as prescribed to help with your symptoms.  You have fever or chills You have swelling of your face/head or neck You are unable to pop your ears. You have: A fever. Pain in your ear. Pain in your head or neck. Fluid draining from your ear. Your hearing suddenly changes. You become very dizzy. You lose your balance. You have any new/concerning or worsening symptoms  Please read the additional information packets attached to your discharge summary.  Do not take your medicine if  develop an itchy rash, swelling in your mouth or lips, or difficulty breathing; call 911 and seek immediate emergency medical attention if this occurs.

## 2019-08-11 ENCOUNTER — Emergency Department (HOSPITAL_COMMUNITY)
Admission: EM | Admit: 2019-08-11 | Discharge: 2019-08-11 | Disposition: A | Discharge status: Home / Self Care | Payer: Self-pay | Attending: Emergency Medicine | Admitting: Emergency Medicine

## 2019-08-11 ENCOUNTER — Other Ambulatory Visit: Payer: Self-pay

## 2019-08-11 ENCOUNTER — Encounter (HOSPITAL_COMMUNITY): Payer: Self-pay | Admitting: *Deleted

## 2019-08-11 DIAGNOSIS — M722 Plantar fascial fibromatosis: Secondary | ICD-10-CM | POA: Insufficient documentation

## 2019-08-11 DIAGNOSIS — M25561 Pain in right knee: Secondary | ICD-10-CM | POA: Insufficient documentation

## 2019-08-11 DIAGNOSIS — F17290 Nicotine dependence, other tobacco product, uncomplicated: Secondary | ICD-10-CM | POA: Insufficient documentation

## 2019-08-11 DIAGNOSIS — Z79899 Other long term (current) drug therapy: Secondary | ICD-10-CM | POA: Insufficient documentation

## 2019-08-11 MED ORDER — ACETAMINOPHEN 500 MG PO TABS
1000.0000 mg | ORAL_TABLET | Freq: Once | ORAL | Status: AC
  Administered 2019-08-11: 1000 mg via ORAL
  Filled 2019-08-11: qty 2

## 2019-08-11 MED ORDER — PREDNISONE 20 MG PO TABS: 40.0000 mg | ORAL_TABLET | Freq: Every day | ORAL | 0 refills | Status: DC

## 2019-08-11 MED ORDER — MELOXICAM 15 MG PO TABS: 15.0000 mg | ORAL_TABLET | Freq: Every day | ORAL | 0 refills | Status: DC

## 2019-08-11 MED ORDER — KETOROLAC TROMETHAMINE 10 MG PO TABS
10.0000 mg | ORAL_TABLET | Freq: Once | ORAL | Status: AC
  Administered 2019-08-11: 10 mg via ORAL
  Filled 2019-08-11: qty 1

## 2019-08-11 MED ORDER — PREDNISONE 20 MG PO TABS
40.0000 mg | ORAL_TABLET | Freq: Once | ORAL | Status: DC
  Filled 2019-08-11: qty 4

## 2019-08-11 NOTE — ED Provider Notes (Signed)
[facility] Provider [facility]   CSN: [id number] Arrival date & time: 08/11/19  1410     History   Chief Complaint Chief Complaint  Patient presents with  . Foot Pain    HPI Derek White is a 28 y.o. male.     Patient is a 28 year old male who presents to the emergency department with a complaint of right foot pain and right knee pain.  Patient states this problem has been going on for nearly a week.  Patient does not identify any specific injury recently.  He has not had any recent operations or procedures on the lower extremity.  There are no broken skin areas or wounds that are not healing on the right lower extremity.  The patient describes pain of the right foot.  He says it extends from the ball of his foot to his arch.  He also describes some right knee pain.  He has not reported any swelling of the foot or the right knee.  He has not reported any fever or chills, nausea or vomiting.  He has tried some Tylenol, but says this is not helping any of his pain.  Certain movements hurt more, walking on it hurts more.  The history is provided by the patient.  Foot Pain Pertinent negatives include no chest pain, no abdominal pain and no shortness of breath.    Past Medical History:  Diagnosis Date  . GERD (gastroesophageal reflux disease)   . Panic attacks     There are no active problems to display for this patient.   History reviewed. No pertinent surgical history.      Home Medications    Prior to Admission medications   Medication Sig Start Date End Date Taking? Authorizing Provider  Aspirin-Acetaminophen-Caffeine (GOODY HEADACHE PO) Take 1 packet by mouth daily as needed (for severe headache pain).    [provider]  cetirizine (ZYRTEC) 10 MG tablet Take 1 tablet (10 mg total) by mouth daily. 07/04/19 08/03/19  Harlene SaltsMorelli, Brandon A, PA-C  cyclobenzaprine (FLEXERIL) 10 MG tablet Take 1 tablet (10 mg total) by mouth 3 (three) times  daily as needed. 12/11/18   Triplett, Tammy, PA-C  diclofenac (VOLTAREN) 75 MG EC tablet Take 1 tablet (75 mg total) by mouth 2 (two) times daily. Take with food 12/11/18   Triplett, Tammy, PA-C  dicyclomine (BENTYL) 20 MG tablet Take 1 pill every 6-8 hours if needed for abdominal cramping Patient taking differently: Take 20 mg by mouth every 6 (six) hours as needed for spasms. Take 1 pill every 6-8 hours if needed for abdominal cramping 11/17/18   Bethann BerkshireZammit, Joseph, MD  famotidine (PEPCID) 20 MG tablet Take 1 tablet (20 mg total) by mouth 2 (two) times daily. Patient not taking: Reported on 12/01/2018 11/17/18   Bethann BerkshireZammit, Joseph, MD  fluticasone [facility](FLONASE) 50 MCG/ACT nasal spray Place 1 spray into both nostrils daily. 07/04/19 08/03/19  Harlene SaltsMorelli, Brandon A, PA-C  Multiple Vitamin (MULTIVITAMIN WITH MINERALS) TABS tablet Take 1 tablet by mouth daily.    [provider]  Omega-3 Fatty Acids (FISH OIL) 500 MG CAPS Take 1 capsule by mouth daily.    [provider]    Family History Family History  Problem Relation Age of Onset  . Diabetes Other     Social History Social History   Tobacco Use  . Smoking status: Light Tobacco Smoker    Years: 9.00    Types: E-cigarettes  . Smokeless tobacco: Current User  Types: Chew  . Tobacco comment: 2 cigarettes daily   Substance Use Topics  . Alcohol use: Yes    Alcohol/week: 5.0 standard drinks    Types: 5 Cans of beer per week    Comment: 3-4 beers daily   . Drug use: Yes    Types: Marijuana    Comment: 2 months ago     Allergies   Cinnamon, Chocolate, and Sunflower oil   Review of Systems Review of Systems  Constitutional: Negative for activity change and appetite change.  HENT: Negative for congestion, ear discharge, ear pain, facial swelling, nosebleeds, rhinorrhea, sneezing and tinnitus.   Eyes: Negative for photophobia, pain and discharge.  Respiratory: Negative for cough, choking, shortness of breath and wheezing.    Cardiovascular: Negative for chest pain, palpitations and leg swelling.  Gastrointestinal: Negative for abdominal pain, blood in stool, constipation, diarrhea, nausea and vomiting.  Genitourinary: Negative for difficulty urinating, dysuria, flank pain, frequency and hematuria.  Musculoskeletal: Positive for arthralgias. Negative for back pain, gait problem, myalgias and neck pain.  Skin: Negative for color change, rash and wound.  Neurological: Negative for dizziness, seizures, syncope, facial asymmetry, speech difficulty, weakness and numbness.  Hematological: Negative for adenopathy. Does not bruise/bleed easily.  Psychiatric/Behavioral: Negative for agitation, confusion, hallucinations, self-injury and suicidal ideas. The patient is not nervous/anxious.      Physical Exam Updated Vital Signs BP (!) 154/86 (BP Location: Right Arm)   Pulse 87   Temp 98.2 F (36.8 C) (Oral)   Resp 16   Ht 5\' 7"  (1.702 m)   Wt 108.9 kg   SpO2 97%   BMI 37.59 kg/m   Physical Exam Vitals signs and nursing note reviewed.  Constitutional:      Appearance: He is well-developed. He is not toxic-appearing.  HENT:     Head: Normocephalic.     Right Ear: Tympanic membrane and external ear normal.     Left Ear: Tympanic membrane and external ear normal.  Eyes:     General: Lids are normal.     Pupils: Pupils are equal, round, and reactive to light.  Neck:     Musculoskeletal: Normal range of motion and neck supple.     Vascular: No carotid bruit.  Cardiovascular:     Rate and Rhythm: Normal rate and regular rhythm.     Pulses: Normal pulses.     Heart sounds: Normal heart sounds.  Pulmonary:     Effort: No respiratory distress.     Breath sounds: Normal breath sounds.  Abdominal:     General: Bowel sounds are normal.     Palpations: Abdomen is soft.     Tenderness: There is no abdominal tenderness. There is no guarding.  Musculoskeletal: Normal range of motion.     Right foot: Tenderness  present.       Feet:  Lymphadenopathy:     Head:     Right side of head: No submandibular adenopathy.     Left side of head: No submandibular adenopathy.     Cervical: No cervical adenopathy.  Skin:    General: Skin is warm and dry.  Neurological:     Mental Status: He is alert and oriented to person, place, and time.     Cranial Nerves: No cranial nerve deficit.     Sensory: No sensory deficit.  Psychiatric:        Speech: Speech normal.      ED Treatments / Results  Labs (all labs ordered are listed, but  only abnormal results are displayed) Labs Reviewed - No data to display  EKG None  Radiology No results found.  Procedures Procedures (including critical care time)  Medications Ordered in ED Medications  predniSONE (DELTASONE) tablet 40 mg (has no administration in time range)  ketorolac (TORADOL) tablet 10 mg (has no administration in time range)  acetaminophen (TYLENOL) tablet 1,000 mg (has no administration in time range)     Initial Impression / Assessment and Plan / ED Course  I have reviewed the triage vital signs and the nursing notes.  Pertinent labs & imaging results that were available during my care of the patient were reviewed by me and considered in my medical decision making (see chart for details).          Final Clinical Impressions(s) / ED Diagnoses MDM  Blood pressure is slightly elevated at 154/86, vital signs are otherwise within normal limits.  Pulse oximetry is 97% on room air.  Within normal limits by my interpretation.  The patient's examination is suggestive of plantar fasciitis.  The knee pain seems to be related to overuse and compensation for right knee pain.  I have given the patient instructions on plantar fasciitis and plantar fascial stretches and exercises.  I have asked the patient to use steroids and anti-inflammatory medication.  He will use Tylenol extra strength in between the anti-inflammatory doses.  The patient is  to follow-up with his primary physician or return to the emergency department if not improving.  Patient is in agreement with this plan.   Final diagnoses:  Plantar fasciitis of right foot  Right knee pain, unspecified chronicity    ED Discharge Orders         Ordered    meloxicam (MOBIC) 15 MG tablet  Daily     08/11/19 1658    predniSONE (DELTASONE) 20 MG tablet  Daily,   Status:  Discontinued     08/11/19 1658    predniSONE (DELTASONE) 20 MG tablet  Daily     08/11/19 1659           Lily Kocher, PA-C 08/12/19 2132    Julianne Rice, MD 08/16/19 [phone]

## 2019-08-11 NOTE — ED Triage Notes (Addendum)
Pt c/o right foot pain that radiates up to right knee x 1 week. Denies any recent injury.

## 2019-08-11 NOTE — Discharge Instructions (Signed)
Your examination suggest plantar fasciitis and right knee pain.  Please practice the plantar fasciitis exercises listed in your discharge instructions.  Please use meloxicam daily with food.  Please use prednisone 2 tablets 1 time daily with food.  This may take a few days to go away.  Please see Dr. Aline Brochure for orthopedic management if not improving.

## 2019-09-20 ENCOUNTER — Other Ambulatory Visit: Payer: Self-pay | Admitting: Occupational Medicine

## 2019-09-20 ENCOUNTER — Other Ambulatory Visit: Payer: Self-pay

## 2019-09-20 ENCOUNTER — Ambulatory Visit: Payer: Self-pay

## 2019-09-20 DIAGNOSIS — M25562 Pain in left knee: Secondary | ICD-10-CM

## 2019-11-09 ENCOUNTER — Telehealth: Payer: Self-pay | Admitting: *Deleted

## 2019-11-09 ENCOUNTER — Ambulatory Visit: Payer: Self-pay | Attending: Internal Medicine

## 2019-11-09 ENCOUNTER — Other Ambulatory Visit: Payer: Self-pay

## 2019-11-09 DIAGNOSIS — Z20822 Contact with and (suspected) exposure to covid-19: Secondary | ICD-10-CM | POA: Insufficient documentation

## 2019-11-09 NOTE — Telephone Encounter (Signed)
Requesting COVID-19 test result.   He was just tested today so it's not ready. I sent him a link to set up MyChart.

## 2019-11-10 LAB — NOVEL CORONAVIRUS, NAA: SARS-CoV-2, NAA: NOT DETECTED

## 2020-07-26 ENCOUNTER — Encounter (HOSPITAL_COMMUNITY): Payer: Self-pay | Admitting: Emergency Medicine

## 2020-07-26 ENCOUNTER — Other Ambulatory Visit: Payer: Self-pay

## 2020-07-26 ENCOUNTER — Emergency Department (HOSPITAL_COMMUNITY)
Admission: EM | Admit: 2020-07-26 | Discharge: 2020-07-26 | Disposition: A | Discharge status: Home / Self Care | Payer: Medicaid Other | Attending: Emergency Medicine | Admitting: Emergency Medicine

## 2020-07-26 DIAGNOSIS — F410 Panic disorder [episodic paroxysmal anxiety] without agoraphobia: Secondary | ICD-10-CM | POA: Insufficient documentation

## 2020-07-26 DIAGNOSIS — H748X2 Other specified disorders of left middle ear and mastoid: Secondary | ICD-10-CM | POA: Diagnosis not present

## 2020-07-26 DIAGNOSIS — F17290 Nicotine dependence, other tobacco product, uncomplicated: Secondary | ICD-10-CM | POA: Insufficient documentation

## 2020-07-26 DIAGNOSIS — U071 COVID-19: Secondary | ICD-10-CM | POA: Diagnosis not present

## 2020-07-26 DIAGNOSIS — Z7982 Long term (current) use of aspirin: Secondary | ICD-10-CM | POA: Insufficient documentation

## 2020-07-26 DIAGNOSIS — R450 Nervousness: Secondary | ICD-10-CM | POA: Diagnosis present

## 2020-07-26 NOTE — ED Triage Notes (Signed)
Pt c/o anxiety and having a panic attack that started when he got here. Pt recently tested for covid due to loss of smell and taste that started x 3 days ago.

## 2020-07-26 NOTE — ED Provider Notes (Signed)
[facility] EMERGENCY DEPARTMENT Provider Note   CSN: [id number] Arrival date & time: 07/26/20  2671     History Chief Complaint  Patient presents with  . Anxiety    MYER BOHLMAN is a 29 y.o. male.  Pt presents to the ED today with a panic attack.  His son is here getting evaluated for a fever and n/v.  Pt has a White of panic attacks and had one while waiting.  Pt has had a fever, cough, and uri sx.  He was tested for Covid yesterday.  No results yet.  He took some Mucinex for his URI sx and thinks that may have set off his panic attack.  Pt said his panic attack is gone and he is feeling better.         Past Medical History:  Diagnosis Date  . GERD (gastroesophageal reflux disease)   . Panic attacks     There are no problems to display for this patient.   History reviewed. No pertinent surgical history.     Family History  Problem Relation Age of Onset  . Diabetes Other     Social History   Tobacco Use  . Smoking status: Light Tobacco Smoker    Years: 9.00    Types: E-cigarettes  . Smokeless tobacco: Current User    Types: Chew  . Tobacco comment: 2 cigarettes daily   Vaping Use  . Vaping Use: Every day  Substance Use Topics  . Alcohol use: Yes    Alcohol/week: 5.0 standard drinks    Types: 5 Cans of beer per week    Comment: 3-4 beers daily   . Drug use: Yes    Types: Marijuana    Comment: 2 months ago    Home Medications Prior to Admission medications   Medication Sig Start Date End Date Taking? Authorizing Provider  Aspirin-Acetaminophen-Caffeine (GOODY HEADACHE PO) Take 1 packet by mouth daily as needed (for severe headache pain).    [provider]  cetirizine (ZYRTEC) 10 MG tablet Take 1 tablet (10 mg total) by mouth daily. 07/04/19 08/03/19  Harlene Salts A, PA-C  cyclobenzaprine (FLEXERIL) 10 MG tablet Take 1 tablet (10 mg total) by mouth 3 (three) times daily as needed. 12/11/18   Triplett, Tammy, PA-C  diclofenac (VOLTAREN)  75 MG EC tablet Take 1 tablet (75 mg total) by mouth 2 (two) times daily. Take with food 12/11/18   Triplett, Tammy, PA-C  dicyclomine (BENTYL) 20 MG tablet Take 1 pill every 6-8 hours if needed for abdominal cramping Patient taking differently: Take 20 mg by mouth every 6 (six) hours as needed for spasms. Take 1 pill every 6-8 hours if needed for abdominal cramping 11/17/18   Bethann Berkshire, MD  famotidine (PEPCID) 20 MG tablet Take 1 tablet (20 mg total) by mouth 2 (two) times daily. Patient not taking: Reported on 12/01/2018 11/17/18   Bethann Berkshire, MD  fluticasone [facility]) 50 MCG/ACT nasal spray Place 1 spray into both nostrils daily. 07/04/19 08/03/19  Harlene Salts A, PA-C  meloxicam (MOBIC) 15 MG tablet Take 1 tablet (15 mg total) by mouth daily. 08/11/19   Ivery Quale, PA-C  Multiple Vitamin (MULTIVITAMIN WITH MINERALS) TABS tablet Take 1 tablet by mouth daily.    [provider]  Omega-3 Fatty Acids (FISH OIL) 500 MG CAPS Take 1 capsule by mouth daily.    [provider]  predniSONE (DELTASONE) 20 MG tablet Take 2 tablets (40 mg total) by mouth daily. 08/11/19  Ivery Quale, PA-C    Allergies    Cinnamon, Chocolate, and Sunflower oil  Review of Systems   Review of Systems  HENT: Positive for congestion and ear pain.   Psychiatric/Behavioral: The patient is nervous/anxious.   All other systems reviewed and are negative.   Physical Exam Updated Vital Signs BP (!) 146/93   Pulse 69   Temp 97.9 F (36.6 C)   Resp 18   Ht 5\' 7"  (1.702 m)   Wt 108.9 kg   SpO2 100%   BMI 37.60 kg/m   Physical Exam Vitals and nursing note reviewed.  Constitutional:      Appearance: Normal appearance.  HENT:     Head: Normocephalic and atraumatic.     Right Ear: External ear normal.     Left Ear: External ear normal. A middle ear effusion is present.     Nose: Nose normal.     Mouth/Throat:     Mouth: Mucous membranes are moist.     Pharynx: Oropharynx is clear.    Eyes:     Extraocular Movements: Extraocular movements intact.     Conjunctiva/sclera: Conjunctivae normal.     Pupils: Pupils are equal, round, and reactive to light.  Cardiovascular:     Rate and Rhythm: Normal rate and regular rhythm.     Pulses: Normal pulses.     Heart sounds: Normal heart sounds.  Pulmonary:     Effort: Pulmonary effort is normal.     Breath sounds: Normal breath sounds.  Abdominal:     General: Abdomen is flat. Bowel sounds are normal.     Palpations: Abdomen is soft.  Musculoskeletal:        General: Normal range of motion.     Cervical back: Normal range of motion and neck supple.  Skin:    General: Skin is warm.     Capillary Refill: Capillary refill takes less than 2 seconds.  Neurological:     General: No focal deficit present.     Mental Status: He is alert and oriented to person, place, and time.  Psychiatric:        Mood and Affect: Mood normal.        Behavior: Behavior normal.     ED Results / Procedures / Treatments   Labs (all labs ordered are listed, but only abnormal results are displayed) Labs Reviewed - No data to display  EKG None  Radiology No results found.  Procedures Procedures (including critical care time)  Medications Ordered in ED Medications - No data to display  ED Course  I have reviewed the triage vital signs and the nursing notes.  Pertinent labs & imaging results that were available during my care of the patient were reviewed by me and considered in my medical decision making (see chart for details).    MDM Rules/Calculators/A&P                          Pt's son tested positive for Covid, so I strongly suspect pt has Covid.  He was tested yesterday, so I did not repeat the test.  Pt is stable for d/c.  Return if worse.  DAIJON WENKE was evaluated in Emergency Department on 07/26/2020 for the symptoms described in the history of present illness. He was evaluated in the context of the global COVID-19  pandemic, which necessitated consideration that the patient might be at risk for infection with the SARS-CoV-2 virus that causes COVID-19.  Institutional protocols and algorithms that pertain to the evaluation of patients at risk for COVID-19 are in a state of rapid change based on information released by regulatory bodies including the CDC and federal and state organizations. These policies and algorithms were followed during the patient's care in the ED. Final Clinical Impression(s) / ED Diagnoses Final diagnoses:  Panic attack  COVID-19    Rx / DC Orders ED Discharge Orders    None       Jacalyn Lefevre, MD 07/26/20 [phone]

## 2020-11-22 ENCOUNTER — Other Ambulatory Visit: Payer: Self-pay

## 2020-11-22 ENCOUNTER — Emergency Department (HOSPITAL_COMMUNITY)
Admission: EM | Admit: 2020-11-22 | Discharge: 2020-11-22 | Discharge status: Against Medical Advice | Payer: Medicaid Other

## 2020-11-23 ENCOUNTER — Emergency Department (HOSPITAL_COMMUNITY)
Admission: EM | Admit: 2020-11-23 | Discharge: 2020-11-23 | Disposition: A | Discharge status: Home / Self Care | Payer: Medicaid Other | Attending: Emergency Medicine | Admitting: Emergency Medicine

## 2020-11-23 ENCOUNTER — Encounter (HOSPITAL_COMMUNITY): Payer: Self-pay

## 2020-11-23 ENCOUNTER — Other Ambulatory Visit: Payer: Self-pay

## 2020-11-23 DIAGNOSIS — H6693 Otitis media, unspecified, bilateral: Secondary | ICD-10-CM | POA: Diagnosis not present

## 2020-11-23 DIAGNOSIS — H66003 Acute suppurative otitis media without spontaneous rupture of ear drum, bilateral: Secondary | ICD-10-CM

## 2020-11-23 DIAGNOSIS — H9201 Otalgia, right ear: Secondary | ICD-10-CM | POA: Diagnosis present

## 2020-11-23 DIAGNOSIS — Z87891 Personal history of nicotine dependence: Secondary | ICD-10-CM | POA: Insufficient documentation

## 2020-11-23 MED ORDER — AZITHROMYCIN 250 MG PO TABS: ORAL_TABLET | ORAL | 0 refills | Status: DC

## 2020-11-23 NOTE — ED Notes (Signed)
Pts linens & fresh male wick applied.

## 2020-11-23 NOTE — ED Triage Notes (Signed)
Pt c/o right ear pain that started 3 days ago.

## 2020-11-23 NOTE — ED Notes (Signed)
ED Provider at bedside. 

## 2020-11-23 NOTE — ED Provider Notes (Signed)
[facility] EMERGENCY DEPARTMENT Provider Note   CSN: [id number] Arrival date & time: 11/23/20  4132     History Chief Complaint  Patient presents with  . Otalgia    Derek White is a 30 y.o. male.  Patient is a 30 year old male with past medical history of reflux.  He presents today for evaluation of right ear pain.  He describes decreased hearing and discomfort that started 3 days ago and is worsening.  He denies fevers or chills.  He denies cough.  The history is provided by the patient.  Otalgia Location:  Right Quality:  Aching Severity:  Moderate Onset quality:  Gradual Duration:  3 days Timing:  Constant Progression:  Worsening Chronicity:  New Relieved by:  Nothing Worsened by:  Nothing      Past Medical History:  Diagnosis Date  . GERD (gastroesophageal reflux disease)   . Panic attacks     There are no problems to display for this patient.   No past surgical history on file.     Family History  Problem Relation Age of Onset  . Diabetes Other     Social History   Tobacco Use  . Smoking status: Former Smoker    Years: 9.00    Types: E-cigarettes  . Smokeless tobacco: Current User    Types: Chew  . Tobacco comment: 2 cigarettes daily   Vaping Use  . Vaping Use: Every day  Substance Use Topics  . Alcohol use: Yes    Alcohol/week: 5.0 standard drinks    Types: 5 Cans of beer per week    Comment: 3-4 beers daily   . Drug use: Yes    Types: Marijuana    Comment: 2 months ago    Home Medications Prior to Admission medications   Medication Sig Start Date End Date Taking? Authorizing Provider  Aspirin-Acetaminophen-Caffeine (GOODY HEADACHE PO) Take 1 packet by mouth daily as needed (for severe headache pain).    [provider]  cetirizine (ZYRTEC) 10 MG tablet Take 1 tablet (10 mg total) by mouth daily. 07/04/19 08/03/19  Harlene Salts A, PA-C  cyclobenzaprine (FLEXERIL) 10 MG tablet Take 1 tablet (10 mg total) by mouth 3  (three) times daily as needed. 12/11/18   Triplett, Tammy, PA-C  diclofenac (VOLTAREN) 75 MG EC tablet Take 1 tablet (75 mg total) by mouth 2 (two) times daily. Take with food 12/11/18   Triplett, Tammy, PA-C  dicyclomine (BENTYL) 20 MG tablet Take 1 pill every 6-8 hours if needed for abdominal cramping Patient taking differently: Take 20 mg by mouth every 6 (six) hours as needed for spasms. Take 1 pill every 6-8 hours if needed for abdominal cramping 11/17/18   Bethann Berkshire, MD  famotidine (PEPCID) 20 MG tablet Take 1 tablet (20 mg total) by mouth 2 (two) times daily. Patient not taking: Reported on 12/01/2018 11/17/18   Bethann Berkshire, MD  fluticasone [facility]) 50 MCG/ACT nasal spray Place 1 spray into both nostrils daily. 07/04/19 08/03/19  Harlene Salts A, PA-C  meloxicam (MOBIC) 15 MG tablet Take 1 tablet (15 mg total) by mouth daily. 08/11/19   Ivery Quale, PA-C  Multiple Vitamin (MULTIVITAMIN WITH MINERALS) TABS tablet Take 1 tablet by mouth daily.    [provider]  Omega-3 Fatty Acids (FISH OIL) 500 MG CAPS Take 1 capsule by mouth daily.    [provider]  predniSONE (DELTASONE) 20 MG tablet Take 2 tablets (40 mg total) by mouth daily. 08/11/19   Ivery Quale,  PA-C    Allergies    Cinnamon, Chocolate, and Sunflower oil  Review of Systems   Review of Systems  HENT: Positive for ear pain.   All other systems reviewed and are negative.   Physical Exam Updated Vital Signs BP (!) 143/94   Pulse 65   Temp 98.2 F (36.8 C) (Oral)   Resp 18   Ht 5\' 6"  (1.676 m)   Wt 104.3 kg   SpO2 95%   BMI 37.12 kg/m   Physical Exam Vitals and nursing note reviewed.  Constitutional:      General: He is not in acute distress.    Appearance: Normal appearance. He is not ill-appearing, toxic-appearing or diaphoretic.  HENT:     Head: Normocephalic and atraumatic.     Ears:     Comments: The right ear canal appears grossly normal.  There is scant wax noted.  The TM is  erythematous and thickened.  The left ear canal appears grossly normal.  There is erythema and inflammation noted to the left TM. Pulmonary:     Effort: Pulmonary effort is normal.  Musculoskeletal:        General: Normal range of motion.  Skin:    General: Skin is warm and dry.  Neurological:     Mental Status: He is alert.     ED Results / Procedures / Treatments   Labs (all labs ordered are listed, but only abnormal results are displayed) Labs Reviewed - No data to display  EKG None  Radiology No results found.  Procedures Procedures   Medications Ordered in ED Medications - No data to display  ED Course  I have reviewed the triage vital signs and the nursing notes.  Pertinent labs & imaging results that were available during my care of the patient were reviewed by me and considered in my medical decision making (see chart for details).    MDM Rules/Calculators/A&P  Patient presenting with a several day history of ear pain and decreased hearing.  He appears to have bilateral otitis media on exam.  Patient will be treated with antibiotics and decongestants.  He is to follow-up as needed.  Patient states he has had ongoing issues with his ears and would like further investigation.  I will refer him to ENT with whom he can make an appointment.  Final Clinical Impression(s) / ED Diagnoses Final diagnoses:  None    Rx / DC Orders ED Discharge Orders    None       , MD 11/23/20 [phone]

## 2020-11-23 NOTE — Discharge Instructions (Signed)
Begin taking Zithromax as prescribed.  Take Sudafed as per package recommendations as needed for congestion.  Follow-up with ear, nose, and throat if your issues persist.  The contact information for [facility] has been provided in this discharge summary for you to call and make these arrangements.

## 2020-11-30 ENCOUNTER — Encounter (HOSPITAL_COMMUNITY): Payer: Self-pay | Admitting: *Deleted

## 2020-11-30 ENCOUNTER — Other Ambulatory Visit: Payer: Self-pay

## 2020-11-30 ENCOUNTER — Emergency Department (HOSPITAL_COMMUNITY)
Admission: EM | Admit: 2020-11-30 | Discharge: 2020-11-30 | Disposition: A | Discharge status: Home / Self Care | Payer: Medicaid Other | Attending: Emergency Medicine | Admitting: Emergency Medicine

## 2020-11-30 DIAGNOSIS — J3489 Other specified disorders of nose and nasal sinuses: Secondary | ICD-10-CM | POA: Diagnosis not present

## 2020-11-30 DIAGNOSIS — R0981 Nasal congestion: Secondary | ICD-10-CM | POA: Insufficient documentation

## 2020-11-30 DIAGNOSIS — Z87891 Personal history of nicotine dependence: Secondary | ICD-10-CM | POA: Insufficient documentation

## 2020-11-30 DIAGNOSIS — H6501 Acute serous otitis media, right ear: Secondary | ICD-10-CM | POA: Diagnosis not present

## 2020-11-30 DIAGNOSIS — H9201 Otalgia, right ear: Secondary | ICD-10-CM | POA: Diagnosis present

## 2020-11-30 DIAGNOSIS — H6121 Impacted cerumen, right ear: Secondary | ICD-10-CM | POA: Insufficient documentation

## 2020-11-30 MED ORDER — FLUTICASONE PROPIONATE 50 MCG/ACT NA SUSP: 1.0000 | Freq: Every day | NASAL | 2 refills | Status: DC

## 2020-11-30 MED ORDER — CETIRIZINE-PSEUDOEPHEDRINE ER 5-120 MG PO TB12: 1.0000 | ORAL_TABLET | Freq: Every day | ORAL | 0 refills | Status: DC

## 2020-11-30 NOTE — ED Notes (Signed)
Entered room and introduced self to patient. Pt appears to be resting in chair, respirations are even and unlabored with equal chest rise and fall. Call bell within reach. Pt educated on call light use and hourly rounding, verbalized understanding and in agreement at this time. All questions and concerns voiced addressed. Refreshments offered and provided per patient request.  

## 2020-11-30 NOTE — Discharge Instructions (Addendum)
Your ear infection appears to be healing without need for additional antibiotics.  You still have a clear fluid in your right middle ear which should go away on its own and the medicines prescribed may help this process.  However,  keep your appointment with the ENT MD if you continue to have symptoms as discussed (especially the hearing loss).

## 2020-11-30 NOTE — ED Triage Notes (Signed)
Right ear ache, seen last week for same

## 2020-12-03 ENCOUNTER — Emergency Department (HOSPITAL_COMMUNITY)
Admission: EM | Admit: 2020-12-03 | Discharge: 2020-12-03 | Disposition: A | Discharge status: Home / Self Care | Payer: Medicaid Other | Attending: Emergency Medicine | Admitting: Emergency Medicine

## 2020-12-03 ENCOUNTER — Encounter (HOSPITAL_COMMUNITY): Payer: Self-pay | Admitting: Emergency Medicine

## 2020-12-03 ENCOUNTER — Other Ambulatory Visit: Payer: Self-pay

## 2020-12-03 DIAGNOSIS — F17220 Nicotine dependence, chewing tobacco, uncomplicated: Secondary | ICD-10-CM | POA: Insufficient documentation

## 2020-12-03 DIAGNOSIS — H9201 Otalgia, right ear: Secondary | ICD-10-CM | POA: Diagnosis present

## 2020-12-03 DIAGNOSIS — H6091 Unspecified otitis externa, right ear: Secondary | ICD-10-CM | POA: Diagnosis not present

## 2020-12-03 DIAGNOSIS — H60501 Unspecified acute noninfective otitis externa, right ear: Secondary | ICD-10-CM

## 2020-12-03 MED ORDER — AMOXICILLIN-POT CLAVULANATE 875-125 MG PO TABS: 1.0000 | ORAL_TABLET | Freq: Two times a day (BID) | ORAL | 0 refills | Status: DC

## 2020-12-03 MED ORDER — OFLOXACIN 0.3 % OP SOLN
10.0000 [drp] | Freq: Every day | OPHTHALMIC | Status: DC
  Administered 2020-12-03: 10 [drp] via OTIC
  Filled 2020-12-03: qty 5

## 2020-12-03 NOTE — Discharge Instructions (Signed)
Use 10 drops of ofloxacin in the right ear daily for the next 7 days.   You were given a prescription for augmentin as well. Please take the antibiotic prescription fully.   Please call the ear nose and throat doctor tomorrow the schedule a follow up appointment within the next 3-5 days. Please return to the emergency department for any new or worsening symptoms.

## 2020-12-03 NOTE — ED Provider Notes (Signed)
[facility] EMERGENCY DEPARTMENT Provider Note   CSN: [id number] Arrival date & time: 11/30/20  9379     History Chief Complaint  Patient presents with  . Otalgia    [facility] White is a 30 y.o. male presenting for a recheck of his right ear secondary to persistent pain and reduced right hearing acuity, now with drainage from the right ear.  He was seen here on 2/3, placed on zithromax for bilateral otitis media and completed the entire course.  He had several right sided ear pain until just several days ago, still present but better tolerated.  He has used qtips to try to clean the right ear, after which he had discharge, concerned about possibly injuring his ear.  He is scheduled to f/u with ENT but not for several weeks.  Denies fevers, headache, dental pain, eye pain, no significant sinus drainage but does have intermittent nasal congestion.  HPI     Past Medical History:  Diagnosis Date  . GERD (gastroesophageal reflux disease)   . Panic attacks     There are no problems to display for this patient.   History reviewed. No pertinent surgical history.     Family History  Problem Relation Age of Onset  . Diabetes Other     Social History   Tobacco Use  . Smoking status: Former Smoker    Years: 9.00    Types: E-cigarettes  . Smokeless tobacco: Current User    Types: Chew  . Tobacco comment: 2 cigarettes daily   Vaping Use  . Vaping Use: Every day  Substance Use Topics  . Alcohol use: Yes    Alcohol/week: 5.0 standard drinks    Types: 5 Cans of beer per week    Comment: 3-4 beers daily   . Drug use: Yes    Types: Marijuana    Comment: 2 months ago    Home Medications Prior to Admission medications   Medication Sig Start Date End Date Taking? Authorizing Provider  cetirizine-pseudoephedrine (ZYRTEC-D) 5-120 MG tablet Take 1 tablet by mouth daily. 11/30/20  Yes Kathia Covington, Raynelle Fanning, PA-C  fluticasone (FLONASE) 50 MCG/ACT nasal spray Place 1 spray into both  nostrils daily. 11/30/20  Yes Muriel Wilber, Raynelle Fanning, PA-C  Aspirin-Acetaminophen-Caffeine (GOODY HEADACHE PO) Take 1 packet by mouth daily as needed (for severe headache pain).    [provider]  azithromycin (ZITHROMAX Z-PAK) 250 MG tablet 2 po day one, then 1 daily x 4 days 11/23/20   Geoffery Lyons, MD  cyclobenzaprine (FLEXERIL) 10 MG tablet Take 1 tablet (10 mg total) by mouth 3 (three) times daily as needed. 12/11/18   Triplett, Tammy, PA-C  diclofenac (VOLTAREN) 75 MG EC tablet Take 1 tablet (75 mg total) by mouth 2 (two) times daily. Take with food 12/11/18   Triplett, Tammy, PA-C  dicyclomine (BENTYL) 20 MG tablet Take 1 pill every 6-8 hours if needed for abdominal cramping Patient taking differently: Take 20 mg by mouth every 6 (six) hours as needed for spasms. Take 1 pill every 6-8 hours if needed for abdominal cramping 11/17/18   Bethann Berkshire, MD  famotidine (PEPCID) 20 MG tablet Take 1 tablet (20 mg total) by mouth 2 (two) times daily. Patient not taking: Reported on 12/01/2018 11/17/18   Bethann Berkshire, MD  meloxicam (MOBIC) 15 MG tablet Take 1 tablet (15 mg total) by mouth daily. 08/11/19   Ivery Quale, PA-C  Multiple Vitamin (MULTIVITAMIN WITH MINERALS) TABS tablet Take 1 tablet by mouth daily.    [provider]  Omega-3 Fatty Acids (FISH OIL) 500 MG CAPS Take 1 capsule by mouth daily.    [provider]  predniSONE (DELTASONE) 20 MG tablet Take 2 tablets (40 mg total) by mouth daily. 08/11/19   Ivery Quale, PA-C  cetirizine (ZYRTEC) 10 MG tablet Take 1 tablet (10 mg total) by mouth daily. 07/04/19 11/30/20  Bill Salinas, PA-C    Allergies    Cinnamon, Chocolate, and Sunflower oil  Review of Systems   Review of Systems  Constitutional: Negative for chills and fever.  HENT: Positive for congestion, ear discharge, ear pain and hearing loss. Negative for rhinorrhea, sinus pressure, sinus pain, sore throat, trouble swallowing and voice change.   Eyes: Negative  for discharge.  Respiratory: Negative.   Cardiovascular: Negative.   Gastrointestinal: Negative.   Genitourinary: Negative.   All other systems reviewed and are negative.   Physical Exam Updated Vital Signs BP 136/89 (BP Location: Right Arm)   Pulse 60   Temp 98.3 F (36.8 C) (Oral)   Resp 18   Ht 5\' 7"  (1.702 m)   Wt 104.3 kg   SpO2 98%   BMI 36.02 kg/m   Physical Exam Vitals reviewed.  Constitutional:      Appearance: He is well-developed and well-nourished.  HENT:     Head: Normocephalic and atraumatic.     Right Ear: Ear canal normal. Decreased hearing noted. A middle ear effusion is present. There is no impacted cerumen.     Left Ear: Tympanic membrane and ear canal normal. No drainage. There is no impacted cerumen. Tympanic membrane is not erythematous, retracted or bulging.     Ears:     Comments: Difficulty fully viewing right TM secondary to non bloody, dark,  thick appearing cerumen.    Nose: Mucosal edema and rhinorrhea present.     Mouth/Throat:     Mouth: Oropharynx is clear and moist and mucous membranes are normal.     Pharynx: Uvula midline. No oropharyngeal exudate, posterior oropharyngeal edema or posterior oropharyngeal erythema.     Tonsils: No tonsillar abscesses.  Eyes:     Conjunctiva/sclera: Conjunctivae normal.  Cardiovascular:     Rate and Rhythm: Normal rate.     Heart sounds: Normal heart sounds.  Pulmonary:     Effort: Pulmonary effort is normal. No respiratory distress.     Breath sounds: No wheezing or rales.  Abdominal:     Palpations: Abdomen is soft.     Tenderness: There is no abdominal tenderness.  Musculoskeletal:        General: Normal range of motion.  Skin:    General: Skin is warm and dry.     Findings: No rash.  Neurological:     Mental Status: He is alert and oriented to person, place, and time.  Psychiatric:        Mood and Affect: Mood and affect normal.     ED Results / Procedures / Treatments   Labs (all labs  ordered are listed, but only abnormal results are displayed) Labs Reviewed - No data to display  EKG None  Radiology No results found.  Procedures .Ear Cerumen Removal  Date/Time: 12/03/2020 10:23 AM Performed by: 12/05/2020, PA-C Authorized by: Burgess Amor, PA-C   Consent:    Consent obtained:  Verbal   Consent given by:  Patient   Risks, benefits, and alternatives were discussed: yes   Procedure details:    Location:  R ear   Procedure type: curette  Procedure outcomes: cerumen removed   Post-procedure details:    Inspection:  No bleeding and TM intact   Hearing quality:  Improved (still reduced hearing acuity right, but improved)   Procedure completion:  Tolerated well, no immediate complications     Medications Ordered in ED Medications - No data to display  ED Course  I have reviewed the triage vital signs and the nursing notes.  Pertinent labs & imaging results that were available during my care of the patient were reviewed by me and considered in my medical decision making (see chart for details).    MDM Rules/Calculators/A&P                          Pt with right ear effusion, no ruptured TM, moderate right cerumen without impaction.   No indication of active otitis media or need for additional abx.  Suspect some degree of eustachian dysfunction given sx and h/o sinus issues.  Recommended zyrtec d, flonase for topical steroid to help further reduce effusion. Encouraged to keep appt with ENT as planned. Final Clinical Impression(s) / ED Diagnoses Final diagnoses:  Non-recurrent acute serous otitis media of right ear  Ceruminosis, right    Rx / DC Orders ED Discharge Orders         Ordered    fluticasone (FLONASE) 50 MCG/ACT nasal spray  Daily        11/30/20 1246    cetirizine-pseudoephedrine (ZYRTEC-D) 5-120 MG tablet  Daily        11/30/20 1246           Burgess Amor, Cordelia Poche 12/03/20 1027    Bethann Berkshire, MD 12/03/20 1028

## 2020-12-03 NOTE — ED Provider Notes (Cosign Needed Addendum)
[name] EMERGENCY DEPARTMENT Provider Note   CSN: [id number] Arrival date & time: 12/03/20  1946     History Chief Complaint  Patient presents with  . Otalgia    Derek White is a 30 y.o. male.  HPI   30 year old male with a history of GERD, panic attacks, who presents to the emergency department today for evaluation of right ear pain.  States he has had right ear pain for the last 2 weeks and also has some decreased hearing secondary to this.  He denies any fevers or other respiratory symptoms.  He was initially seen in the ED about a week ago and was started on a course of azithromycin.  He was seen a few days ago and had a cerumen disimpaction.  At that time he was placed on Flonase, Zyrtec and a topical steroid as there was some concern for eustachian tube dysfunction.  Since then he has had persistent pain.  He denies any drainage from the ear.  States he does not use Q-tips. He has an appt with ent on 3/1  Past Medical History:  Diagnosis Date  . GERD (gastroesophageal reflux disease)   . Panic attacks     There are no problems to display for this patient.   History reviewed. No pertinent surgical history.     Family History  Problem Relation Age of Onset  . Diabetes Other     Social History   Tobacco Use  . Smoking status: Former Smoker    Years: 9.00    Types: E-cigarettes  . Smokeless tobacco: Current User    Types: Chew  . Tobacco comment: 2 cigarettes daily   Vaping Use  . Vaping Use: Every day  . Substances: Nicotine  Substance Use Topics  . Alcohol use: Yes    Alcohol/week: 5.0 standard drinks    Types: 5 Cans of beer per week    Comment: 3-4 beers daily   . Drug use: Yes    Types: Marijuana    Comment: 2 months ago    Home Medications Prior to Admission medications   Medication Sig Start Date End Date Taking? Authorizing Provider  amoxicillin-clavulanate (AUGMENTIN) 875-125 MG tablet Take 1 tablet by mouth every 12 (twelve)  hours. 12/03/20  Yes Unnamed Hino S, PA-C  Aspirin-Acetaminophen-Caffeine (GOODY HEADACHE PO) Take 1 packet by mouth daily as needed (for severe headache pain).    [provider]  azithromycin (ZITHROMAX Z-PAK) 250 MG tablet 2 po day one, then 1 daily x 4 days 11/23/20   Geoffery Lyons, MD  cetirizine-pseudoephedrine (ZYRTEC-D) 5-120 MG tablet Take 1 tablet by mouth daily. 11/30/20   Burgess Amor, PA-C  cyclobenzaprine (FLEXERIL) 10 MG tablet Take 1 tablet (10 mg total) by mouth 3 (three) times daily as needed. 12/11/18   Triplett, Tammy, PA-C  diclofenac (VOLTAREN) 75 MG EC tablet Take 1 tablet (75 mg total) by mouth 2 (two) times daily. Take with food 12/11/18   Triplett, Tammy, PA-C  dicyclomine (BENTYL) 20 MG tablet Take 1 pill every 6-8 hours if needed for abdominal cramping Patient taking differently: Take 20 mg by mouth every 6 (six) hours as needed for spasms. Take 1 pill every 6-8 hours if needed for abdominal cramping 11/17/18   Bethann Berkshire, MD  famotidine (PEPCID) 20 MG tablet Take 1 tablet (20 mg total) by mouth 2 (two) times daily. Patient not taking: Reported on 12/01/2018 11/17/18   Bethann Berkshire, MD  fluticasone [facility]) 50 MCG/ACT nasal spray Place 1  spray into both nostrils daily. 11/30/20   Burgess Amor, PA-C  meloxicam (MOBIC) 15 MG tablet Take 1 tablet (15 mg total) by mouth daily. 08/11/19   Ivery Quale, PA-C  Multiple Vitamin (MULTIVITAMIN WITH MINERALS) TABS tablet Take 1 tablet by mouth daily.    [provider]  Omega-3 Fatty Acids (FISH OIL) 500 MG CAPS Take 1 capsule by mouth daily.    [provider]  predniSONE (DELTASONE) 20 MG tablet Take 2 tablets (40 mg total) by mouth daily. 08/11/19   Ivery Quale, PA-C  cetirizine (ZYRTEC) 10 MG tablet Take 1 tablet (10 mg total) by mouth daily. 07/04/19 11/30/20  Bill Salinas, PA-C    Allergies    Cinnamon, Chocolate, and Sunflower oil  Review of Systems   Review of Systems  Constitutional:  Negative for fever.  HENT: Positive for ear pain. Negative for congestion and sore throat.   Respiratory: Negative for cough.   Gastrointestinal: Negative for nausea and vomiting.  Neurological: Negative for headaches.    Physical Exam Updated Vital Signs BP 135/79 (BP Location: Right Arm)   Pulse 68   Temp 98 F (36.7 C) (Oral)   Resp 18   Ht 5\' 7"  (1.702 m)   Wt 104.3 kg   SpO2 100%   BMI 36.02 kg/m   Physical Exam Constitutional:      General: He is not in acute distress.    Appearance: He is well-developed and well-nourished.  HENT:     Left Ear: Tympanic membrane normal.     Ears:     Comments: Right tm unable to be visualized. Appears to have some debris in the canal consistent with otitis externa. No mastoid ttp. Eyes:     Conjunctiva/sclera: Conjunctivae normal.  Cardiovascular:     Rate and Rhythm: Normal rate and regular rhythm.  Pulmonary:     Effort: Pulmonary effort is normal.     Breath sounds: Normal breath sounds.  Skin:    General: Skin is warm and dry.  Neurological:     Mental Status: He is alert and oriented to person, place, and time.     ED Results / Procedures / Treatments   Labs (all labs ordered are listed, but only abnormal results are displayed) Labs Reviewed - No data to display  EKG None  Radiology No results found.  Procedures Procedures   Medications Ordered in ED Medications  ofloxacin (OCUFLOX) 0.3 % ophthalmic solution 10 drop (10 drops Right EAR Given 12/03/20 2148)    ED Course  I have reviewed the triage vital signs and the nursing notes.  Pertinent labs & imaging results that were available during my care of the patient were reviewed by me and considered in my medical decision making (see chart for details).    MDM Rules/Calculators/A&P                          Otitis externa  Pt presenting persistent right ear pain. Recent cerumen disimpaction and otitis media prior to that. No canal occlusion, Pt afebrile  in NAD. Exam non concerning for mastoiditis, cellulitis or malignant OE. Ear wick placed in ED. Dc with ofloxacin script. I also gave rx for augmentin to cover possible recurrent OM. I recommended prednisone  As well but pt declined. I advised f/u with ENT and strict return precautions. He voices understanding of the plan and reasons to return. All questions answered, pt stable for discharge.  Final Clinical Impression(s) /  ED Diagnoses Final diagnoses:  Acute otitis externa of right ear, unspecified type    Rx / DC Orders ED Discharge Orders         Ordered    amoxicillin-clavulanate (AUGMENTIN) 875-125 MG tablet  Every 12 hours        12/03/20 2042           Karrie Meres, PA-C 12/03/20 2245    Karrie Meres, PA-C 12/03/20 2246    Benjiman Core, MD 12/05/20 [phone]

## 2020-12-03 NOTE — ED Triage Notes (Signed)
Pt to the ED for persistent rt ear pain.

## 2021-03-29 ENCOUNTER — Emergency Department (HOSPITAL_COMMUNITY)
Admission: EM | Admit: 2021-03-29 | Discharge: 2021-03-29 | Disposition: A | Discharge status: Home / Self Care | Payer: Medicaid Other | Attending: Emergency Medicine | Admitting: Emergency Medicine

## 2021-03-29 ENCOUNTER — Encounter (HOSPITAL_COMMUNITY): Payer: Self-pay | Admitting: *Deleted

## 2021-03-29 ENCOUNTER — Other Ambulatory Visit: Payer: Self-pay

## 2021-03-29 DIAGNOSIS — K2100 Gastro-esophageal reflux disease with esophagitis, without bleeding: Secondary | ICD-10-CM | POA: Insufficient documentation

## 2021-03-29 DIAGNOSIS — K2900 Acute gastritis without bleeding: Secondary | ICD-10-CM | POA: Diagnosis not present

## 2021-03-29 DIAGNOSIS — Z87891 Personal history of nicotine dependence: Secondary | ICD-10-CM | POA: Insufficient documentation

## 2021-03-29 DIAGNOSIS — R1084 Generalized abdominal pain: Secondary | ICD-10-CM | POA: Diagnosis present

## 2021-03-29 LAB — CBC WITH DIFFERENTIAL/PLATELET
Abs Immature Granulocytes: 0.03 10*3/uL (ref 0.00–0.07)
Basophils Absolute: 0.1 10*3/uL (ref 0.0–0.1)
Basophils Relative: 1 %
Eosinophils Absolute: 0.1 10*3/uL (ref 0.0–0.5)
Eosinophils Relative: 1 %
HCT: 44.4 % (ref 39.0–52.0)
Hemoglobin: 14.7 g/dL (ref 13.0–17.0)
Immature Granulocytes: 0 %
Lymphocytes Relative: 26 %
Lymphs Abs: 2 10*3/uL (ref 0.7–4.0)
MCH: 30.7 pg (ref 26.0–34.0)
MCHC: 33.1 g/dL (ref 30.0–36.0)
MCV: 92.7 fL (ref 80.0–100.0)
Monocytes Absolute: 0.7 10*3/uL (ref 0.1–1.0)
Monocytes Relative: 9 %
Neutro Abs: 4.8 10*3/uL (ref 1.7–7.7)
Neutrophils Relative %: 63 %
Platelets: 203 10*3/uL (ref 150–400)
RBC: 4.79 MIL/uL (ref 4.22–5.81)
RDW: 13 % (ref 11.5–15.5)
WBC: 7.7 10*3/uL (ref 4.0–10.5)
nRBC: 0 % (ref 0.0–0.2)

## 2021-03-29 LAB — COMPREHENSIVE METABOLIC PANEL
ALT: 59 U/L — ABNORMAL HIGH (ref 0–44)
AST: 32 U/L (ref 15–41)
Albumin: 4.4 g/dL (ref 3.5–5.0)
Alkaline Phosphatase: 43 U/L (ref 38–126)
Anion gap: 7 (ref 5–15)
BUN: 20 mg/dL (ref 6–20)
CO2: 27 mmol/L (ref 22–32)
Calcium: 9.4 mg/dL (ref 8.9–10.3)
Chloride: 105 mmol/L (ref 98–111)
Creatinine, Ser: 1.12 mg/dL (ref 0.61–1.24)
GFR, Estimated: >60 mL/min (ref >60)
Glucose, Bld: 89 mg/dL (ref 70–99)
Potassium: 4.4 mmol/L (ref 3.5–5.1)
Sodium: 139 mmol/L (ref 135–145)
Total Bilirubin: 1 mg/dL (ref 0.3–1.2)
Total Protein: 7.3 g/dL (ref 6.5–8.1)

## 2021-03-29 LAB — LIPASE, BLOOD: Lipase: 31 U/L (ref 11–51)

## 2021-03-29 MED ORDER — ALUM & MAG HYDROXIDE-SIMETH 200-200-20 MG/5ML PO SUSP
30.0000 mL | Freq: Once | ORAL | Status: AC
  Administered 2021-03-29: 30 mL via ORAL
  Filled 2021-03-29: qty 30

## 2021-03-29 MED ORDER — OMEPRAZOLE MAGNESIUM 20 MG PO TBEC: 20.0000 mg | DELAYED_RELEASE_TABLET | Freq: Every day | ORAL | 1 refills | Status: DC

## 2021-03-29 MED ORDER — LIDOCAINE VISCOUS HCL 2 % MT SOLN
15.0000 mL | Freq: Once | OROMUCOSAL | Status: AC
  Administered 2021-03-29: 15 mL via ORAL
  Filled 2021-03-29: qty 15

## 2021-03-29 NOTE — Discharge Instructions (Addendum)
Schedule to see Gi for evaluation  

## 2021-03-29 NOTE — ED Triage Notes (Signed)
Abdominal pain intermittent for years

## 2021-03-29 NOTE — ED Provider Notes (Addendum)
[facility] EMERGENCY DEPARTMENT Provider Note   CSN: [id number] Arrival date & time: 03/29/21  1603     History Chief Complaint  Patient presents with   Abdominal Pain    Derek White is a 30 y.o. male.  The history is provided by the patient. No language interpreter was used.  Abdominal Pain Pain location:  Generalized Pain quality: aching   Pain radiates to:  Does not radiate Pain severity:  Moderate Onset quality:  Gradual Timing:  Constant Progression:  Worsening Chronicity:  New Relieved by:  Nothing Worsened by:  Nothing Ineffective treatments:  None tried Associated symptoms: no vomiting   Risk factors: no alcohol abuse       Past Medical History:  Diagnosis Date   GERD (gastroesophageal reflux disease)    Panic attacks     There are no problems to display for this patient.   History reviewed. No pertinent surgical history.     Family History  Problem Relation Age of Onset   Diabetes Other     Social History   Tobacco Use   Smoking status: Former    Pack years: 0.00    Types: E-cigarettes   Smokeless tobacco: Current    Types: Chew   Tobacco comments:    2 cigarettes daily   Vaping Use   Vaping Use: Every day   Substances: Nicotine  Substance Use Topics   Alcohol use: Yes    Alcohol/week: 5.0 standard drinks    Types: 5 Cans of beer per week    Comment: 3-4 beers daily    Drug use: Yes    Types: Marijuana    Comment: 2 months ago    Home Medications Prior to Admission medications   Medication Sig Start Date End Date Taking? Authorizing Provider  amoxicillin-clavulanate (AUGMENTIN) 875-125 MG tablet Take 1 tablet by mouth every 12 (twelve) hours. 12/03/20   Couture, Cortni S, PA-C  Aspirin-Acetaminophen-Caffeine (GOODY HEADACHE PO) Take 1 packet by mouth daily as needed (for severe headache pain).    [provider]  azithromycin (ZITHROMAX Z-PAK) 250 MG tablet 2 po day one, then 1 daily x 4 days 11/23/20   Geoffery Lyons, MD  cetirizine-pseudoephedrine (ZYRTEC-D) 5-120 MG tablet Take 1 tablet by mouth daily. 11/30/20   Burgess Amor, PA-C  cyclobenzaprine (FLEXERIL) 10 MG tablet Take 1 tablet (10 mg total) by mouth 3 (three) times daily as needed. 12/11/18   Triplett, Tammy, PA-C  diclofenac (VOLTAREN) 75 MG EC tablet Take 1 tablet (75 mg total) by mouth 2 (two) times daily. Take with food 12/11/18   Triplett, Tammy, PA-C  dicyclomine (BENTYL) 20 MG tablet Take 1 pill every 6-8 hours if needed for abdominal cramping Patient taking differently: Take 20 mg by mouth every 6 (six) hours as needed for spasms. Take 1 pill every 6-8 hours if needed for abdominal cramping 11/17/18   Bethann Berkshire, MD  famotidine (PEPCID) 20 MG tablet Take 1 tablet (20 mg total) by mouth 2 (two) times daily. Patient not taking: Reported on 12/01/2018 11/17/18   Bethann Berkshire, MD  fluticasone [facility]) 50 MCG/ACT nasal spray Place 1 spray into both nostrils daily. 11/30/20   Burgess Amor, PA-C  meloxicam (MOBIC) 15 MG tablet Take 1 tablet (15 mg total) by mouth daily. 08/11/19   Ivery Quale, PA-C  Multiple Vitamin (MULTIVITAMIN WITH MINERALS) TABS tablet Take 1 tablet by mouth daily.    [provider]  Omega-3 Fatty Acids (FISH OIL) 500 MG CAPS Take 1  capsule by mouth daily.    [provider]  predniSONE (DELTASONE) 20 MG tablet Take 2 tablets (40 mg total) by mouth daily. 08/11/19   Ivery Quale, PA-C  cetirizine (ZYRTEC) 10 MG tablet Take 1 tablet (10 mg total) by mouth daily. 07/04/19 11/30/20  Bill Salinas, PA-C    Allergies    Cinnamon, Chocolate, and Sunflower oil  Review of Systems   Review of Systems  Gastrointestinal:  Positive for abdominal pain. Negative for vomiting.  All other systems reviewed and are negative.  Physical Exam Updated Vital Signs BP 130/73   Pulse 60   Temp 98.6 F (37 C) (Oral)   Resp 18   Ht 5\' 7"  (1.702 m)   Wt 102.1 kg   SpO2 100%   BMI 35.24 kg/m   Physical  Exam Vitals and nursing note reviewed.  Constitutional:      Appearance: He is well-developed.  HENT:     Head: Normocephalic and atraumatic.  Eyes:     Conjunctiva/sclera: Conjunctivae normal.  Cardiovascular:     Rate and Rhythm: Normal rate and regular rhythm.     Heart sounds: No murmur heard. Pulmonary:     Effort: Pulmonary effort is normal. No respiratory distress.     Breath sounds: Normal breath sounds.  Abdominal:     General: Abdomen is flat.     Palpations: Abdomen is soft.     Tenderness: There is no abdominal tenderness.  Musculoskeletal:     Cervical back: Neck supple.  Skin:    General: Skin is warm and dry.  Neurological:     Mental Status: He is alert.    ED Results / Procedures / Treatments   Labs (all labs ordered are listed, but only abnormal results are displayed) Labs Reviewed  COMPREHENSIVE METABOLIC PANEL - Abnormal; Notable for the following components:      Result Value   ALT 59 (*)    All other components within normal limits  CBC WITH DIFFERENTIAL/PLATELET  LIPASE, BLOOD    EKG None  Radiology No results found.  Procedures Procedures   Medications Ordered in ED Medications  alum & mag hydroxide-simeth (MAALOX/MYLANTA) 200-200-20 MG/5ML suspension 30 mL (30 mLs Oral Given 03/29/21 1707)    And  lidocaine (XYLOCAINE) 2 % viscous mouth solution 15 mL (15 mLs Oral Given 03/29/21 1707)    ED Course  I have reviewed the triage vital signs and the nursing notes.  Pertinent labs & imaging results that were available during my care of the patient were reviewed by me and considered in my medical decision making (see chart for details).    MDM Rules/Calculators/A&P                         MDM:  Pt counseled on probable gastritis and reflux.  Pt given rx for prilosec Pt advised to schedule to see Gi for evaluation  Final Clinical Impression(s) / ED Diagnoses Final diagnoses:  Acute gastritis without hemorrhage, unspecified gastritis type   Gastroesophageal reflux disease with esophagitis, unspecified whether hemorrhage    Rx / DC Orders ED Discharge Orders          Ordered    omeprazole (PRILOSEC OTC) 20 MG tablet  Daily        03/29/21 1823          An After Visit Summary was printed and given to the patient.    05/29/21, Elson Areas 03/29/21 1823  Elson Areas, Cordelia Poche 03/29/21 Ayesha Mohair    Eber Hong, MD 04/02/21 [phone]

## 2021-05-29 ENCOUNTER — Emergency Department (HOSPITAL_COMMUNITY): Payer: Worker's Compensation

## 2021-05-29 ENCOUNTER — Other Ambulatory Visit: Payer: Self-pay

## 2021-05-29 ENCOUNTER — Emergency Department (HOSPITAL_COMMUNITY)
Admission: EM | Admit: 2021-05-29 | Discharge: 2021-05-29 | Disposition: A | Discharge status: Home / Self Care | Payer: Worker's Compensation | Attending: Emergency Medicine | Admitting: Emergency Medicine

## 2021-05-29 ENCOUNTER — Encounter (HOSPITAL_COMMUNITY): Payer: Self-pay

## 2021-05-29 DIAGNOSIS — Z87891 Personal history of nicotine dependence: Secondary | ICD-10-CM | POA: Diagnosis not present

## 2021-05-29 DIAGNOSIS — X501XXA Overexertion from prolonged static or awkward postures, initial encounter: Secondary | ICD-10-CM | POA: Diagnosis not present

## 2021-05-29 DIAGNOSIS — S99912A Unspecified injury of left ankle, initial encounter: Secondary | ICD-10-CM | POA: Diagnosis present

## 2021-05-29 DIAGNOSIS — M25572 Pain in left ankle and joints of left foot: Secondary | ICD-10-CM

## 2021-05-29 DIAGNOSIS — Y990 Civilian activity done for income or pay: Secondary | ICD-10-CM | POA: Insufficient documentation

## 2021-05-29 DIAGNOSIS — S93402A Sprain of unspecified ligament of left ankle, initial encounter: Secondary | ICD-10-CM | POA: Insufficient documentation

## 2021-05-29 NOTE — ED Triage Notes (Signed)
Pt was at work and as he was coming down 3 steps and the last step he rolled his left ankle. Pt has workers comp papers with him.

## 2021-05-29 NOTE — ED Provider Notes (Signed)
[facility] EMERGENCY DEPARTMENT Provider Note  CSN: [id number] Arrival date & time: 05/29/21 0801    History Chief Complaint  Patient presents with   Ankle Pain    Derek White is a 30 y.o. male reports he stumbled coming down some stairs at work this morning and twisted his L ankle. Complaining of mild pain, L lateral foot/ankle. Able to bear weight. Sent by work for evaluation.    Past Medical History:  Diagnosis Date   GERD (gastroesophageal reflux disease)    Panic attacks     History reviewed. No pertinent surgical history.  Family History  Problem Relation Age of Onset   Diabetes Other     Social History   Tobacco Use   Smoking status: Former    Types: E-cigarettes   Smokeless tobacco: Former    Types: Chew   Tobacco comments:    2 cigarettes daily   Vaping Use   Vaping Use: Every day   Substances: Nicotine  Substance Use Topics   Alcohol use: Yes    Alcohol/week: 5.0 standard drinks    Types: 5 Cans of beer per week    Comment: occasionally   Drug use: Not Currently    Types: Marijuana    Comment: 2 months ago     Home Medications Prior to Admission medications   Medication Sig Start Date End Date Taking? Authorizing Provider  cetirizine (ZYRTEC) 10 MG tablet Take 1 tablet (10 mg total) by mouth daily. 07/04/19 11/30/20  Harlene Salts A, PA-C     Allergies    Cinnamon, Chocolate, and Sunflower oil   Review of Systems   Review of Systems A comprehensive review of systems was completed and negative except as noted in HPI.    Physical Exam BP 137/83   Pulse 71   Temp 97.9 F (36.6 C) (Oral)   Resp 15   Ht 5\' 7"  (1.702 m)   Wt 102.1 kg   SpO2 100%   BMI 35.24 kg/m   Physical Exam Vitals and nursing note reviewed.  HENT:     Head: Normocephalic.     Nose: Nose normal.  Eyes:     Extraocular Movements: Extraocular movements intact.  Pulmonary:     Effort: Pulmonary effort is normal.  Musculoskeletal:        General:  Tenderness (L lateral foot/5th metatarsal) present. No swelling or deformity. Normal range of motion.     Cervical back: Neck supple.  Skin:    Findings: No rash (on exposed skin).  Neurological:     Mental Status: He is alert and oriented to person, place, and time.  Psychiatric:        Mood and Affect: Mood normal.     ED Results / Procedures / Treatments   Labs (all labs ordered are listed, but only abnormal results are displayed) Labs Reviewed - No data to display  EKG None  Radiology DG Ankle Left Port  Result Date: 05/29/2021 CLINICAL DATA:  30 year old male status post missed step, twisting injury and fall. Lateral pain. EXAM: PORTABLE LEFT ANKLE - 2 VIEW COMPARISON:  None. FINDINGS: Bone mineralization is within normal limits. Preserved mortise joint alignment. Talar dome intact. Mild degenerative spurring at the tibial plafond and. No evidence of joint effusion. No acute fracture or dislocation. Calcaneus appears intact. IMPRESSION: No acute fracture or dislocation identified about the left ankle. Electronically Signed   By: 32 M.D.   On: 05/29/2021 09:04   DG Foot Complete Left  Result Date: 05/29/2021 CLINICAL DATA:  30 year old male status post missed step, twisting injury and fall. Lateral pain. EXAM: LEFT FOOT - COMPLETE 3+ VIEW COMPARISON:  Left ankle series today. FINDINGS: Bone mineralization is within normal limits. There is no evidence of fracture or dislocation. There is no evidence of arthropathy or other focal bone abnormality. Soft tissues are unremarkable. IMPRESSION: Negative. Electronically Signed   By: Odessa Fleming M.D.   On: 05/29/2021 09:05    Procedures Procedures  Medications Ordered in the ED Medications - No data to display   MDM Rules/Calculators/A&P MDM Patient with ankle/foot injury this morning. Will send for imaging. Exam is benign.   ED Course  I have reviewed the triage vital signs and the nursing notes.  Pertinent labs & imaging results  that were available during my care of the patient were reviewed by me and considered in my medical decision making (see chart for details).  Clinical Course as of 05/29/21 0923  Tue May 29, 2021  0921 Xrays neg. Patient able to ambulate with minimal difficulty. Recommend rest, ice and elevated, Motrin/APAP for pain.  [CS]    Clinical Course User Index [CS] Pollyann Savoy, MD    Final Clinical Impression(s) / ED Diagnoses Final diagnoses:  Ankle pain, left  Sprain of left ankle, unspecified ligament, initial encounter    Rx / DC Orders ED Discharge Orders     None        Pollyann Savoy, MD 05/29/21 [phone]

## 2021-05-29 NOTE — ED Notes (Signed)
DR assessed during triage

## 2021-09-11 ENCOUNTER — Other Ambulatory Visit: Payer: Self-pay

## 2021-09-11 ENCOUNTER — Emergency Department (HOSPITAL_COMMUNITY): Payer: Medicaid Other

## 2021-09-11 ENCOUNTER — Emergency Department (HOSPITAL_COMMUNITY)
Admission: EM | Admit: 2021-09-11 | Discharge: 2021-09-11 | Disposition: A | Discharge status: Home / Self Care | Payer: Medicaid Other | Attending: Emergency Medicine | Admitting: Emergency Medicine

## 2021-09-11 ENCOUNTER — Encounter (HOSPITAL_COMMUNITY): Payer: Self-pay | Admitting: Emergency Medicine

## 2021-09-11 DIAGNOSIS — R072 Precordial pain: Secondary | ICD-10-CM | POA: Insufficient documentation

## 2021-09-11 DIAGNOSIS — M79605 Pain in left leg: Secondary | ICD-10-CM | POA: Insufficient documentation

## 2021-09-11 DIAGNOSIS — Z87891 Personal history of nicotine dependence: Secondary | ICD-10-CM | POA: Diagnosis not present

## 2021-09-11 DIAGNOSIS — R079 Chest pain, unspecified: Secondary | ICD-10-CM

## 2021-09-11 LAB — CBC
HCT: 45.6 % (ref 39.0–52.0)
Hemoglobin: 15.2 g/dL (ref 13.0–17.0)
MCH: 30.5 pg (ref 26.0–34.0)
MCHC: 33.3 g/dL (ref 30.0–36.0)
MCV: 91.6 fL (ref 80.0–100.0)
Platelets: 194 10*3/uL (ref 150–400)
RBC: 4.98 MIL/uL (ref 4.22–5.81)
RDW: 12.8 % (ref 11.5–15.5)
WBC: 6.6 10*3/uL (ref 4.0–10.5)
nRBC: 0 % (ref 0.0–0.2)

## 2021-09-11 LAB — BASIC METABOLIC PANEL
Anion gap: 7 (ref 5–15)
BUN: 16 mg/dL (ref 6–20)
CO2: 25 mmol/L (ref 22–32)
Calcium: 9.3 mg/dL (ref 8.9–10.3)
Chloride: 107 mmol/L (ref 98–111)
Creatinine, Ser: 1.08 mg/dL (ref 0.61–1.24)
GFR, Estimated: >60 mL/min (ref >60)
Glucose, Bld: 137 mg/dL — ABNORMAL HIGH (ref 70–99)
Potassium: 3.5 mmol/L (ref 3.5–5.1)
Sodium: 139 mmol/L (ref 135–145)

## 2021-09-11 LAB — TROPONIN I (HIGH SENSITIVITY)
Troponin I (High Sensitivity): 3 ng/L (ref <18)
Troponin I (High Sensitivity): 4 ng/L (ref <18)

## 2021-09-11 LAB — D-DIMER, QUANTITATIVE: D-Dimer, Quant: <0.27 ug/mL-FEU (ref 0.00–0.50)

## 2021-09-11 NOTE — ED Notes (Signed)
DC instructions reviewed with pt. No questions or concerns at this time. Will follow up as needed. Ambulated out of ed with steady gait.

## 2021-09-11 NOTE — ED Notes (Signed)
Pt going to xray  

## 2021-09-11 NOTE — Discharge Instructions (Signed)
Tylenol for discomfort.  Return if any problems.   

## 2021-09-11 NOTE — ED Triage Notes (Signed)
Pt  here from home with c/o chest pain that started last night center of the chest some slight nausea and sob , no fevers

## 2021-09-11 NOTE — ED Provider Notes (Signed)
[facility] EMERGENCY DEPARTMENT Provider Note   CSN: [id number] Arrival date & time: 09/11/21  1607     History No chief complaint on file.   Derek White is a 30 y.o. male.  The history is provided by the patient. No language interpreter was used.  Chest Pain Pain location:  Substernal area Pain quality: aching   Pain radiates to:  Does not radiate Pain severity:  Moderate Duration:  1 day Timing:  Constant Progression:  Improving Chronicity:  New Context: intercourse   Relieved by:  Nothing Ineffective treatments:  None tried Associated symptoms: no abdominal pain   Risk factors: male sex   Risk factors: no coronary artery disease, no high cholesterol, no hypertension, no immobilization, no prior DVT/PE, no smoking and no surgery   Pt reports some soreness in left leg,  no calf pain    Past Medical History:  Diagnosis Date   GERD (gastroesophageal reflux disease)    Panic attacks     There are no problems to display for this patient.   History reviewed. No pertinent surgical history.     Family History  Problem Relation Age of Onset   Diabetes Other     Social History   Tobacco Use   Smoking status: Former    Types: E-cigarettes   Smokeless tobacco: Former    Types: Chew   Tobacco comments:    2 cigarettes daily   Vaping Use   Vaping Use: Every day   Substances: Nicotine  Substance Use Topics   Alcohol use: Yes    Alcohol/week: 5.0 standard drinks    Types: 5 Cans of beer per week    Comment: occasionally   Drug use: Not Currently    Types: Marijuana    Comment: 2 months ago    Home Medications Prior to Admission medications   Medication Sig Start Date End Date Taking? Authorizing Provider  cetirizine (ZYRTEC) 10 MG tablet Take 1 tablet (10 mg total) by mouth daily. 07/04/19 11/30/20  Bill Salinas, PA-C    Allergies    Cinnamon, Chocolate, and Sunflower oil  Review of Systems   Review of Systems  Cardiovascular:   Positive for chest pain.  Gastrointestinal:  Negative for abdominal pain.  All other systems reviewed and are negative.  Physical Exam Updated Vital Signs BP 130/74   Pulse (!) 58   Temp 98 F (36.7 C) (Oral)   Resp 16   Ht 5\' 7"  (1.702 m)   Wt 104.3 kg   SpO2 98%   BMI 36.02 kg/m   Physical Exam Vitals and nursing note reviewed.  Constitutional:      General: He is not in acute distress.    Appearance: He is well-developed.  HENT:     Head: Normocephalic and atraumatic.     Nose: Nose normal.     Mouth/Throat:     Mouth: Mucous membranes are moist.  Eyes:     Conjunctiva/sclera: Conjunctivae normal.  Cardiovascular:     Rate and Rhythm: Normal rate and regular rhythm.     Heart sounds: No murmur heard. Pulmonary:     Effort: Pulmonary effort is normal. No respiratory distress.     Breath sounds: Normal breath sounds.  Abdominal:     Palpations: Abdomen is soft.     Tenderness: There is no abdominal tenderness.  Musculoskeletal:        General: No swelling. Normal range of motion.     Cervical back: Normal range of motion  and neck supple.     Comments: Negative homan's   Skin:    General: Skin is warm and dry.     Capillary Refill: Capillary refill takes less than 2 seconds.  Neurological:     General: No focal deficit present.     Mental Status: He is alert.  Psychiatric:        Mood and Affect: Mood normal.    ED Results / Procedures / Treatments   Labs (all labs ordered are listed, but only abnormal results are displayed) Labs Reviewed  BASIC METABOLIC PANEL - Abnormal; Notable for the following components:      Result Value   Glucose, Bld 137 (*)    All other components within normal limits  CBC  D-DIMER, QUANTITATIVE  TROPONIN I (HIGH SENSITIVITY)  TROPONIN I (HIGH SENSITIVITY)    EKG EKG Interpretation  Date/Time:  Tuesday September 11 2021 09:34:04 EST Ventricular Rate:  91 PR Interval:  196 QRS Duration: 88 QT Interval:  336 QTC  Calculation: 413 R Axis:   32 Text Interpretation: Normal sinus rhythm Septal infarct , age undetermined Confirmed by Alvester Chou [phone]) on 09/11/2021 10:29:34 AM  Radiology DG Chest 2 View  Result Date: 09/11/2021 CLINICAL DATA:  Chest pain and shortness of breath EXAM: CHEST - 2 VIEW COMPARISON:  None. FINDINGS: The heart size and mediastinal contours are within normal limits. Both lungs are clear. The visualized skeletal structures are unremarkable. IMPRESSION: No active cardiopulmonary disease. Electronically Signed   By: Larose Hires D.O.   On: 09/11/2021 09:52    Procedures Procedures   Medications Ordered in ED Medications - No data to display  ED Course  I have reviewed the triage vital signs and the nursing notes.  Pertinent labs & imaging results that were available during my care of the patient were reviewed by me and considered in my medical decision making (see chart for details).  Clinical Course as of 09/11/21 1251  Tue Sep 11, 2021  1249 Correction to ECG read - very unlikely to be septal infarct per my interpretation - suspect machine overread of Q-waves in inferior leads, which may be normal for age [MT]    Clinical Course User Index [MT] Renaye Rakers Kermit Balo, MD   MDM Rules/Calculators/A&P                           MDM:  EKG no acute abnormality,Chest xray normal, troponin negative x 2,  Ddimer negative.  Pt advised tylenol for discomfort,  Follow up with primary care  Final Clinical Impression(s) / ED Diagnoses Final diagnoses:  Nonspecific chest pain    Rx / DC Orders ED Discharge Orders     None     An After Visit Summary was printed and given to the patient.    Elson Areas, [place] 09/11/21 1252    Terald Sleeper, MD 09/11/21 1440

## 2022-01-16 ENCOUNTER — Emergency Department (HOSPITAL_COMMUNITY)
Admission: EM | Admit: 2022-01-16 | Discharge: 2022-01-16 | Disposition: A | Discharge status: Home / Self Care | Payer: PRIVATE HEALTH INSURANCE | Attending: Student | Admitting: Student

## 2022-01-16 ENCOUNTER — Encounter (HOSPITAL_COMMUNITY): Payer: Self-pay | Admitting: *Deleted

## 2022-01-16 DIAGNOSIS — M5442 Lumbago with sciatica, left side: Secondary | ICD-10-CM | POA: Insufficient documentation

## 2022-01-16 DIAGNOSIS — M549 Dorsalgia, unspecified: Secondary | ICD-10-CM | POA: Diagnosis present

## 2022-01-16 DIAGNOSIS — M5432 Sciatica, left side: Secondary | ICD-10-CM

## 2022-01-16 MED ORDER — KETOROLAC TROMETHAMINE 30 MG/ML IJ SOLN: 30.0000 mg | Freq: Once | INTRAMUSCULAR | Status: DC

## 2022-01-16 MED ORDER — KETOROLAC TROMETHAMINE 30 MG/ML IJ SOLN
30.0000 mg | Freq: Once | INTRAMUSCULAR | Status: AC
  Administered 2022-01-16: 30 mg via INTRAMUSCULAR
  Filled 2022-01-16: qty 1

## 2022-01-16 NOTE — ED Provider Notes (Signed)
?[facility] ?Provider Note ? ? ?CSN: [id number] ?Arrival date & time: 01/16/22  1848 ? ?  ? ?History ? ?Chief Complaint  ?Patient presents with  ? Back Pain  ? ? ?Derek White is a 31 y.o. male.  Past medical history who presents emergency department with back pain.  ? ?States that he has predominantly left-sided back pain that he describes as sharp and radiates down his left buttock into the back of his left leg.  He states that this has been ongoing for 2 days.  He also endorses tingling in his left leg.  He states that he has been doing more squats lately and wonders if this is contributing to it.  He denies any falls or trauma.  Denies fevers.  Denies IV drug use.  Denies urinary or bowel retention or incontinence, saddle anesthesia, weakness of his extremities. ? ? ? ? ?Back Pain ?Associated symptoms: no fever   ? ?  ? ?Home Medications ?Prior to Admission medications   ?Medication Sig Start Date End Date Taking? Authorizing Provider  ?cetirizine (ZYRTEC) 10 MG tablet Take 1 tablet (10 mg total) by mouth daily. 07/04/19 11/30/20  Bill Salinas, PA-C  ?   ? ?Allergies    ?Cinnamon, Chocolate, and Sunflower oil   ? ?Review of Systems   ?Review of Systems  ?Constitutional:  Negative for fever.  ?Musculoskeletal:  Positive for back pain. Negative for gait problem and neck pain.  ?All other systems reviewed and are negative. ? ?Physical Exam ?Updated Vital Signs ?BP (!) 155/104 (BP Location: Right Arm)   Pulse 72   Temp 98.7 ?F (37.1 ?C) (Oral)   Resp 14   SpO2 99%  ?Physical Exam ?Vitals and nursing note reviewed.  ?Constitutional:   ?   General: He is not in acute distress. ?   Appearance: Normal appearance. He is normal weight. He is not ill-appearing or toxic-appearing.  ?HENT:  ?   Head: Normocephalic and atraumatic.  ?Eyes:  ?   General: No scleral icterus. ?   Extraocular Movements: Extraocular movements intact.  ?Cardiovascular:  ?   Pulses: Normal pulses.  ?Pulmonary:  ?    Effort: Pulmonary effort is normal. No respiratory distress.  ?Musculoskeletal:     ?   General: Tenderness present. No swelling or signs of injury. Normal range of motion.  ?   Cervical back: Neck supple. No tenderness.  ?   Lumbar back: Tenderness present. No bony tenderness.  ?     Back: ? ?Skin: ?   General: Skin is warm and dry.  ?   Capillary Refill: Capillary refill takes less than 2 seconds.  ?   Findings: No rash.  ?Neurological:  ?   General: No focal deficit present.  ?   Mental Status: He is alert and oriented to person, place, and time. Mental status is at baseline.  ?   Sensory: No sensory deficit.  ?   Motor: No weakness.  ?   Gait: Gait normal.  ?Psychiatric:     ?   Mood and Affect: Mood normal.     ?   Behavior: Behavior normal.     ?   Thought Content: Thought content normal.     ?   Judgment: Judgment normal.  ? ? ?ED Results / Procedures / Treatments   ?Labs ?(all labs ordered are listed, but only abnormal results are displayed) ?Labs Reviewed - No data to display ? ?EKG ?None ? ?Radiology ?No results  found. ? ?Procedures ?Procedures  ? ?Medications Ordered in ED ?Medications  ?ketorolac (TORADOL) 30 MG/ML injection 30 mg (30 mg Intramuscular Given 01/16/22 1935)  ? ? ?ED Course/ Medical Decision Making/ A&P ?  ?                        ?Medical Decision Making ?Risk ?Prescription drug management. ? ?This patient presents to the ED for concern of low back pain, this involves an extensive number of treatment options, and is a complaint that carries with it a high risk of complications and morbidity.  ? ?Co morbidities that complicate the patient evaluation ?None ? ?Additional history obtained:  ?Additional history obtained from: None ?External records from outside source obtained and reviewed including: None ? ?Medications  ?I ordered medication including Toradol for pain ?Reevaluation of the patient after medication shows that patient  n/a ? ?Tests Considered: ?MRI lumbar spine ? ?ED  Course: ?31 year old male who presents to the emergency department with left-sided low back pain that radiates down his left leg. ? ?Presentation is most consistent with sciatica.  He has positive left-sided straight leg raise test.  There is no back pain red flags on history or physical exam.  His presentation is not consistent with malignancy or fractures.  He has no trauma or bony tenderness to palpation.  Symptoms are also not consistent with cauda equina he has no bowel or urinary incontinence/retention, no saddle anesthesia, no distal weakness.  No abdominal pain.  No IV drug use or fevers concerning for osteomyelitis or epidural abscess.  Pain is not consistent with pyelonephritis such as CVA tenderness.  No indication for imaging at this time. ? ?Offer the patient Decadron with his Toradol.  He declines at this time.  Discussed that he should continue to use Motrin over the next few days as well as a heating pad intermittently.  He can also use IcyHot or massage as well as gentle stretching to reduce inflammation from the sciatic nerve.  He is instructed to return to emergency department for red flags such as cauda equina symptoms or fevers.  He verbalized understanding.  Otherwise safe for discharge ? ?After consideration of the diagnostic results and the patients response to treatment, I feel that the patent would benefit from  discharge. ?The patient has been appropriately medically screened and/or stabilized in the ED. I have low suspicion for any other emergent medical condition which would require further screening, evaluation or treatment in the ED or require inpatient management. The patient is overall well appearing and non-toxic in appearance. They are hemodynamically stable at time of discharge.   ?Final Clinical Impression(s) / ED Diagnoses ?Final diagnoses:  ?Sciatica of left side  ? ? ?Rx / DC Orders ?ED Discharge Orders   ? ? None  ? ?  ? ? ?  ?Cristopher Peru, PA-C ?01/16/22 1950 ? ?   ?Glendora Score, MD ?01/17/22 0116 ? ?

## 2022-01-16 NOTE — Discharge Instructions (Addendum)
You are seen in the emergency department today for pain in your back that shoots down your left leg.  This is likely sciatica.  We gave you some pain medication while you are here.  It may benefit you for you to continue to take Motrin every 8 hours over the next couple of days to reduce inflammation.  You can also use heat heating pad, IcyHot like we discussed at the bedside and then do gentle stretching.  Please return to emergency department if you are unable to walk, have difficulty peeing or having a bowel movement, numbness or tingling in your groin or begin to have fevers. ?

## 2022-01-16 NOTE — ED Triage Notes (Signed)
Pt in c/o bil lower back pain that radiates L leg, denies bowel and bladder incontinence, onset x 2 days, A&O x4 ?

## 2022-01-17 ENCOUNTER — Emergency Department (HOSPITAL_COMMUNITY): Payer: PRIVATE HEALTH INSURANCE

## 2022-01-17 ENCOUNTER — Other Ambulatory Visit: Payer: Self-pay

## 2022-01-17 ENCOUNTER — Emergency Department (HOSPITAL_COMMUNITY)
Admission: EM | Admit: 2022-01-17 | Discharge: 2022-01-17 | Disposition: A | Discharge status: Home / Self Care | Payer: PRIVATE HEALTH INSURANCE | Attending: Emergency Medicine | Admitting: Emergency Medicine

## 2022-01-17 ENCOUNTER — Encounter (HOSPITAL_COMMUNITY): Payer: Self-pay | Admitting: Emergency Medicine

## 2022-01-17 DIAGNOSIS — M5442 Lumbago with sciatica, left side: Secondary | ICD-10-CM | POA: Insufficient documentation

## 2022-01-17 DIAGNOSIS — R030 Elevated blood-pressure reading, without diagnosis of hypertension: Secondary | ICD-10-CM | POA: Insufficient documentation

## 2022-01-17 DIAGNOSIS — M5441 Lumbago with sciatica, right side: Secondary | ICD-10-CM | POA: Diagnosis not present

## 2022-01-17 DIAGNOSIS — M5440 Lumbago with sciatica, unspecified side: Secondary | ICD-10-CM

## 2022-01-17 DIAGNOSIS — M545 Low back pain, unspecified: Secondary | ICD-10-CM | POA: Diagnosis present

## 2022-01-17 MED ORDER — IBUPROFEN 800 MG PO TABS: 800.0000 mg | ORAL_TABLET | Freq: Three times a day (TID) | ORAL | 0 refills | Status: AC | PRN

## 2022-01-17 MED ORDER — CYCLOBENZAPRINE HCL 10 MG PO TABS: 10.0000 mg | ORAL_TABLET | Freq: Three times a day (TID) | ORAL | 0 refills | Status: AC | PRN

## 2022-01-17 NOTE — Discharge Instructions (Addendum)
Follow-up with Dr. Romeo Apple in 1 to 2 weeks if not improving.  Also your blood pressure is mildly elevated and should be checked again in a week.  He should try to get yourself a family doctor ?

## 2022-01-17 NOTE — ED Notes (Signed)
Patient transported to X-ray 

## 2022-01-17 NOTE — ED Triage Notes (Signed)
Pt endorses mid lower back pain that radiates down left leg mostly and some down right leg.  ?

## 2022-01-17 NOTE — ED Provider Notes (Signed)
?[facility] EMER[facility]ENC[facility] DEPART[facility] ?Provider Note ? ? ?CSN: [id number] ?Arrival date & time: 01/17/22  0709 ? ?  ? ?History ? ?Chief Complaint  ?Patient presents with  ? Back Pain  ? ? ?Derek White is a 31 y.o. male. ? ?Patient complains of lower back pain.  Patient complains of some pain running down his left leg.  Derek White has been taking Motrin.  Derek White has no other medical problems ? ?The history is provided by the patient and medical records. No language interpreter was used.  ?Back Pain ?Location:  Lumbar spine ?Quality:  Aching ?Radiates to:  L thigh ?Pain severity:  Moderate ?Pain is:  Same all the time ?Onset quality:  Gradual ?Timing:  Intermittent ?Progression:  Waxing and waning ?Chronicity:  New ?Context: not emotional stress   ?Relieved by:  Nothing ?Worsened by:  Nothing ?Associated symptoms: no abdominal pain, no chest pain and no headaches   ? ?  ? ?Home Medications ?Prior to Admission medications   ?Medication Sig Start Date End Date Taking? Authorizing Provider  ?cyclobenzaprine (FLEXERIL) 10 MG tablet Take 1 tablet (10 mg total) by mouth 3 (three) times daily as needed for muscle spasms. 01/17/22  Yes Bethann Berkshire, MD  ?ibuprofen (ADVIL) 800 MG tablet Take 1 tablet (800 mg total) by mouth every 8 (eight) hours as needed for moderate pain. 01/17/22  Yes Bethann Berkshire, MD  ?cetirizine (ZYRTEC) 10 MG tablet Take 1 tablet (10 mg total) by mouth daily. 07/04/19 11/30/20  Bill Salinas, PA-C  ?   ? ?Allergies    ?Cinnamon, Chocolate, and Sunflower oil   ? ?Review of Systems   ?Review of Systems  ?Constitutional:  Negative for appetite change and fatigue.  ?HENT:  Negative for congestion, ear discharge and sinus pressure.   ?Eyes:  Negative for discharge.  ?Respiratory:  Negative for cough.   ?Cardiovascular:  Negative for chest pain.  ?Gastrointestinal:  Negative for abdominal pain and diarrhea.  ?Genitourinary:  Negative for frequency and hematuria.  ?Musculoskeletal:  Positive for back pain.  ?Skin:   Negative for rash.  ?Neurological:  Negative for seizures and headaches.  ?Psychiatric/Behavioral:  Negative for hallucinations.   ? ?Physical Exam ?Updated Vital Signs ?BP (!) 147/99   Pulse 78   Temp 97.9 ?F (36.6 ?C) (Oral)   Resp 16   Ht 5\' 7"  (1.702 m)   Wt 95.3 kg   SpO2 98%   BMI 32.89 kg/m?  ?Physical Exam ?Vitals and nursing note reviewed.  ?Constitutional:   ?   Appearance: Derek White is well-developed.  ?HENT:  ?   Head: Normocephalic.  ?   Nose: Nose normal.  ?Eyes:  ?   General: No scleral icterus. ?   Conjunctiva/sclera: Conjunctivae normal.  ?Neck:  ?   Thyroid: No thyromegaly.  ?Cardiovascular:  ?   Rate and Rhythm: Normal rate and regular rhythm.  ?   Heart sounds: No murmur heard. ?  No friction rub. No gallop.  ?Pulmonary:  ?   Breath sounds: No stridor. No wheezing or rales.  ?Chest:  ?   Chest wall: No tenderness.  ?Abdominal:  ?   General: There is no distension.  ?   Tenderness: There is no abdominal tenderness. There is no rebound.  ?Musculoskeletal:  ?   Cervical back: Neck supple.  ?   Comments: Mild lumbar muscle tenderness on the left.  Negative straight leg raise  ?Lymphadenopathy:  ?   Cervical: No cervical adenopathy.  ?Skin: ?   Findings:  No erythema or rash.  ?Neurological:  ?   Mental Status: Derek White is alert and oriented to person, place, and time.  ?   Motor: No abnormal muscle tone.  ?   Coordination: Coordination normal.  ?Psychiatric:     ?   Behavior: Behavior normal.  ? ? ?ED Results / Procedures / Treatments   ?Labs ?(all labs ordered are listed, but only abnormal results are displayed) ?Labs Reviewed - No data to display ? ?EKG ?None ? ?Radiology ?DG Lumbar Spine Complete ? ?Result Date: 01/17/2022 ?CLINICAL DATA:  Lower back and bilateral lower extremity pain without known injury. EXAM: LUMBAR SPINE - COMPLETE 4+ VIEW COMPARISON:  None. FINDINGS: There is no evidence of lumbar spine fracture. Alignment is normal. Intervertebral disc spaces are maintained. IMPRESSION: Negative.  Electronically Signed   By: Marijo Conception M.D.   On: 01/17/2022 08:08   ? ?Procedures ?Procedures  ? ? ?Medications Ordered in ED ?Medications - No data to display ? ?ED Course/ Medical Decision Making/ A&P ?  ?                        ?Medical Decision Making ?Amount and/or Complexity of Data Reviewed ?Radiology: ordered. ? ?Risk ?Prescription drug management. ? ?This patient presents to the ED for concern of back pain, this involves an extensive number of treatment options, and is a complaint that carries with it a high risk of complications and morbidity.  The differential diagnosis includes ruptured disc, muscle pain, fractured vertebrae ? ? ?Co morbidities that complicate the patient evaluation ? ?None ? ? ?Additional history obtained: ? ?Additional history obtained from pt. ?External records from outside source obtained and reviewed including hospital records ? ? ?Lab Tests: ? ?No lab ? ?Imaging Studies ordered: ? ?I ordered imaging studies including lumbar spine series ?I independently visualized and interpreted imaging which showed negative ?I agree with the radiologist interpretation ? ? ?Cardiac Monitoring: / EKG: ? ?The patient was maintained on a cardiac monitor.  I personally viewed and interpreted the cardiac monitored which showed an underlying rhythm of: Normal sinus rhythm ? ? ?Consultations Obtained: ?No consult ? ?Problem List / ED Course / Critical interventions / Medication management ? ?Back pain and mildly elevated blood pressure ?No medicines ordered ?Reevaluation of the patient after these medicines showed that the patient stayed the same ?I have reviewed the patients home medicines and have made adjustments as needed ? ? ?Social Determinants of Health: ? ?None ? ? ?Test / Admission - Considered: ? ?MRI back ? ? ? ? ? ?Patient with lumbar strain and some sciatic pain down his left leg.  Derek White will be placed on Motrin 800  3 times a day and Flexeril.  Patient has mildly elevated blood pressure  and will have that rechecked in the next couple weeks ? ? ? ? ? ? ? ?Final Clinical Impression(s) / ED Diagnoses ?Final diagnoses:  ?Acute bilateral low back pain with sciatica, sciatica laterality unspecified  ? ? ?Rx / DC Orders ?ED Discharge Orders   ? ?      Ordered  ?  ibuprofen (ADVIL) 800 MG tablet  Every 8 hours PRN       ? 01/17/22 0829  ?  cyclobenzaprine (FLEXERIL) 10 MG tablet  3 times daily PRN       ? 01/17/22 [id number]  ? ?  ?  ? ?  ? ? ?  ?Milton Ferguson, MD ?01/17/22 [phone] ? ?

## 2022-02-15 ENCOUNTER — Ambulatory Visit
Admission: EM | Admit: 2022-02-15 | Discharge: 2022-02-15 | Disposition: A | Discharge status: Nursing Home (Includes ICF) | Payer: PRIVATE HEALTH INSURANCE | Attending: Nurse Practitioner | Admitting: Nurse Practitioner

## 2022-02-15 ENCOUNTER — Emergency Department (HOSPITAL_COMMUNITY): Payer: Medicaid Other

## 2022-02-15 ENCOUNTER — Encounter: Payer: Self-pay | Admitting: Emergency Medicine

## 2022-02-15 ENCOUNTER — Other Ambulatory Visit: Payer: Self-pay

## 2022-02-15 ENCOUNTER — Emergency Department (HOSPITAL_COMMUNITY)
Admission: EM | Admit: 2022-02-15 | Discharge: 2022-02-15 | Disposition: A | Discharge status: Home / Self Care | Payer: Medicaid Other | Attending: Emergency Medicine | Admitting: Emergency Medicine

## 2022-02-15 DIAGNOSIS — R0602 Shortness of breath: Secondary | ICD-10-CM | POA: Insufficient documentation

## 2022-02-15 DIAGNOSIS — R112 Nausea with vomiting, unspecified: Secondary | ICD-10-CM | POA: Diagnosis not present

## 2022-02-15 DIAGNOSIS — R0789 Other chest pain: Secondary | ICD-10-CM | POA: Diagnosis present

## 2022-02-15 DIAGNOSIS — R079 Chest pain, unspecified: Secondary | ICD-10-CM

## 2022-02-15 LAB — BASIC METABOLIC PANEL
Anion gap: 9 (ref 5–15)
BUN: 15 mg/dL (ref 6–20)
CO2: 25 mmol/L (ref 22–32)
Calcium: 9.3 mg/dL (ref 8.9–10.3)
Chloride: 105 mmol/L (ref 98–111)
Creatinine, Ser: 0.97 mg/dL (ref 0.61–1.24)
GFR, Estimated: >60 mL/min (ref >60)
Glucose, Bld: 98 mg/dL (ref 70–99)
Potassium: 3.4 mmol/L — ABNORMAL LOW (ref 3.5–5.1)
Sodium: 139 mmol/L (ref 135–145)

## 2022-02-15 LAB — CBC
HCT: 44.3 % (ref 39.0–52.0)
Hemoglobin: 15 g/dL (ref 13.0–17.0)
MCH: 30.6 pg (ref 26.0–34.0)
MCHC: 33.9 g/dL (ref 30.0–36.0)
MCV: 90.4 fL (ref 80.0–100.0)
Platelets: 222 10*3/uL (ref 150–400)
RBC: 4.9 MIL/uL (ref 4.22–5.81)
RDW: 12.9 % (ref 11.5–15.5)
WBC: 7.9 10*3/uL (ref 4.0–10.5)
nRBC: 0 % (ref 0.0–0.2)

## 2022-02-15 LAB — TROPONIN I (HIGH SENSITIVITY): Troponin I (High Sensitivity): 2 ng/L (ref <18)

## 2022-02-15 MED ORDER — FAMOTIDINE 20 MG PO TABS: 20.0000 mg | ORAL_TABLET | Freq: Two times a day (BID) | ORAL | 0 refills | Status: AC

## 2022-02-15 MED ORDER — ASPIRIN 81 MG PO CHEW
324.0000 mg | CHEWABLE_TABLET | Freq: Once | ORAL | Status: AC
  Administered 2022-02-15: 324 mg via ORAL

## 2022-02-15 MED ORDER — ALUM & MAG HYDROXIDE-SIMETH 200-200-20 MG/5ML PO SUSP
30.0000 mL | Freq: Once | ORAL | Status: AC
  Administered 2022-02-15: 30 mL via ORAL
  Filled 2022-02-15: qty 30

## 2022-02-15 NOTE — ED Triage Notes (Signed)
Patient brought in by [facility] from urgent care.  Patient has had chest pain x1 month.  States he was at work today when his left arm started to feel heavy, became diaphoretic, and short of breath around lunch time today.  Patient has been hypertensive with EMS 146/101.  Urgent care gave patient 324mg  of ASA.  ?

## 2022-02-15 NOTE — ED Provider Notes (Signed)
?[facility] ?Provider Note ? ? ?CSN: [id number] ?Arrival date & time: 02/15/22  1703 ? ?  ? ?History ? ?Chief Complaint  ?Patient presents with  ? Chest Pain  ? ? ?[name] is a 31 y.o. male. ? ? ?Chest Pain ? ?Patient with medical see notable for GERD and anxiety presents today due to left-sided chest pain.  Chest pain started 1 month ago, worse in the last 48 hours.  States that the pain is always there but the severity comes and goes.  Unable identify what provokes the pain, usually feels like heaviness and pressure, sometimes it feels sharp.  States it feels like pins-and-needles moving up his left arm.  Endorses nausea, 1 episode of emesis yesterday.  We will short of breath intermittently.  Has not taken anything for the pain over-the-counter because he does not like medicine, was seen in urgent care earlier today and sent to ED for further evaluation.  He was given aspirin at the urgent care. ? ?No history of PE or ACS, he is a former cigarette usage, quit greater than 90 days ago.  Does not currently take medicine for any comorbidities, no recent travel or surgeries. ? ?Home Medications ?Prior to Admission medications   ?Medication Sig Start Date End Date Taking? Authorizing Provider  ?famotidine (PEPCID) 20 MG tablet Take 1 tablet (20 mg total) by mouth 2 (two) times daily. 02/15/22  Yes Theron Arista, PA-C  ?ibuprofen (ADVIL) 800 MG tablet Take 1 tablet (800 mg total) by mouth every 8 (eight) hours as needed for moderate pain. 01/17/22  Yes Bethann Berkshire, MD  ?cyclobenzaprine (FLEXERIL) 10 MG tablet Take 1 tablet (10 mg total) by mouth 3 (three) times daily as needed for muscle spasms. ?Patient not taking: Reported on 02/15/2022 01/17/22   Bethann Berkshire, MD  ?cetirizine (ZYRTEC) 10 MG tablet Take 1 tablet (10 mg total) by mouth daily. 07/04/19 11/30/20  Bill Salinas, PA-C  ?   ? ?Allergies    ?Cinnamon, Chocolate, and Sunflower oil   ? ?Review of Systems   ?Review of Systems   ?Cardiovascular:  Positive for chest pain.  ? ?Physical Exam ?Updated Vital Signs ?BP (!) 126/92   Pulse 72   Temp 98.2 ?F (36.8 ?C) (Oral)   Resp 20   Ht 5\' 7"  (1.702 m)   Wt 95.3 kg   SpO2 100%   BMI 32.89 kg/m?  ?Physical Exam ?Vitals and nursing note reviewed. Exam conducted with a chaperone present.  ?Constitutional:   ?   Appearance: Normal appearance. He is obese.  ?HENT:  ?   Head: Normocephalic and atraumatic.  ?Eyes:  ?   General: No scleral icterus.    ?   Right eye: No discharge.     ?   Left eye: No discharge.  ?   Extraocular Movements: Extraocular movements intact.  ?   Pupils: Pupils are equal, round, and reactive to light.  ?Cardiovascular:  ?   Rate and Rhythm: Normal rate and regular rhythm.  ?   Pulses: Normal pulses.  ?   Heart sounds: Normal heart sounds. No murmur heard. ?  No friction rub. No gallop.  ?Pulmonary:  ?   Effort: Pulmonary effort is normal. No respiratory distress.  ?   Breath sounds: Normal breath sounds.  ?Abdominal:  ?   General: Abdomen is flat. Bowel sounds are normal. There is no distension.  ?   Palpations: Abdomen is soft.  ?   Tenderness: There is  no abdominal tenderness.  ?Skin: ?   General: Skin is warm and dry.  ?   Coloration: Skin is not jaundiced.  ?Neurological:  ?   Mental Status: He is alert. Mental status is at baseline.  ?   Coordination: Coordination normal.  ? ? ?ED Results / Procedures / Treatments   ?Labs ?(all labs ordered are listed, but only abnormal results are displayed) ?Labs Reviewed  ?BASIC METABOLIC PANEL - Abnormal; Notable for the following components:  ?    Result Value  ? Potassium 3.4 (*)   ? All other components within normal limits  ?CBC  ?TROPONIN I (HIGH SENSITIVITY)  ? ? ?EKG ?EKG Interpretation ? ?Date/Time:  Friday February 15 2022 17:13:52 EDT ?Ventricular Rate:  93 ?PR Interval:  184 ?QRS Duration: 88 ?QT Interval:  330 ?QTC Calculation: 411 ?R Axis:   119 ?Text Interpretation: Sinus rhythm Anteroseptal infarct, age  indeterminate Confirmed by Eber HongMiller, Brian ([id number]) on 02/15/2022 5:43:03 PM ? ?Radiology ?DG Chest 2 View ? ?Result Date: 02/15/2022 ?CLINICAL DATA:  Chest pain. Left arm heaviness. Shortness of breath. EXAM: CHEST - 2 VIEW COMPARISON:  09/11/2021 FINDINGS: The cardiomediastinal contours are normal. Minor bibasilar atelectasis. Pulmonary vasculature is normal. No consolidation, pleural effusion, or pneumothorax. No acute osseous abnormalities are seen. IMPRESSION: Minor bibasilar atelectasis. Electronically Signed   By: Narda RutherfordMelanie  Sanford M.D.   On: 02/15/2022 17:52   ? ?Procedures ?Procedures  ? ? ?Medications Ordered in ED ?Medications  ?alum & mag hydroxide-simeth (MAALOX/MYLANTA) 200-200-20 MG/5ML suspension 30 mL (30 mLs Oral Given 02/15/22 1841)  ? ? ?ED Course/ Medical Decision Making/ A&P ?  ?                        ?Medical Decision Making ?Amount and/or Complexity of Data Reviewed ?Labs: ordered. ?Radiology: ordered. ? ?Risk ?OTC drugs. ? ? ?This patient presents to the ED for concern of chest pain, this involves an extensive number of treatment options, and is a complaint that carries with it a high risk of complications and morbidity.  The differential diagnosis includes ACS, PE, GERD, pneumothorax, pneumonia, pericarditis, aortic dissection, esophageal rupture ? ? ? ?Additional history obtained:  ? ?I reviewed prior EKGs, patient's current EKG is roughly unchanged compared to prior.  Reviewed external records, patient has been seen by ENT in the past but does not have establish care with a primary care provider.  No previous visits with a cardiologist.  Has been seen in the ED previously for nonspecific chest pain and gastritis. ? ?  ?Lab Tests: ? ?I ordered, viewed, and personally interpreted labs.  The pertinent results include: No leukocytosis or anemia, slight hypokalemia at 3.4.  No gross electrolyte derangement or AKI.  Troponin low at 2. ? ?  ?Imaging Studies ordered: ? ?I directly visualized the chest  x-ray, which showed bilateral atelectasis but no widened mediastinum, pneumonia or cardiomegaly ? ?I agree with the radiologist interpretation ?  ? ?ECG/Cardiac monitoring:  ? ?Per my interpretation, EKG shows sinus rhythm, roughly unchanged compared to previous. ? ?The patient was maintained on a cardiac monitor.  Visualized monitor strip which showed sinus rhythm, heart rate roughly 72 per my interpretation.  ? ? ?Medicines ordered and prescription drug management: ? ?I ordered medication including: Maalox ? ?I have reviewed the patients home medicines and have made adjustments as needed ? ? ?Test Considered: ? ?I considered PE but patient is PERC negative. ? ?Considered ACS, patient has a low heart  score 2.  There is no ischemic findings noted on the EKG, patient low risk for ACS especially given low initial troponin.  Do not feel second troponin indicated at this time. ? ? ?Reevaluation: ? ?After the interventions noted above, I reevaluated the patient and found patient's pain is improved. ? ? ?Problems addressed / ED Course: ?Chest pain-work-up overall reassuring, physical exam is unrevealing but there is no murmurs or lower extremity swelling.  Lungs are clear to auscultation, no hypoxia.  Presentation is not consistent with acute ACS, PE, pericarditis, pneumonia, dissection, esophageal rupture.  Will provide patient and outpatient follow-up with cardiology, I feel this time needs additional work-up emergently in the ED.  Discussed work-up with patient as well as strict return precautions, he verbalized understanding and is discharged in stable condition. ?  ?Social Determinants of Health: ?No PCP ?  ?Disposition: ? ? ?After consideration of the diagnostic results and the patients response to treatment, I feel that the patent would benefit from outpatient cardiology follow-up. ? ?  ? ? ? ? ? ? ? ? ?Final Clinical Impression(s) / ED Diagnoses ?Final diagnoses:  ?Nonspecific chest pain  ? ? ?Rx / DC Orders ?ED  Discharge Orders   ? ?      Ordered  ?  famotidine (PEPCID) 20 MG tablet  2 times daily       ? 02/15/22 1855  ? ?  ?  ? ?  ? ? ?  ?Theron Arista, PA-C ?02/15/22 1906 ? ?  ?Eber Hong, MD ?02/15/22 2156 ? ?

## 2022-02-15 NOTE — ED Notes (Signed)
Report called to Heather,RN at [facility]. 

## 2022-02-15 NOTE — ED Notes (Signed)
EMS at bedside. Report given to paramedics.  ?

## 2022-02-15 NOTE — Discharge Instructions (Addendum)
Patient sent to the ER via EMS for further evaluation of left-sided chest pain. ?

## 2022-02-15 NOTE — Discharge Instructions (Signed)
Schedule an appointment with cardiology, call Monday morning to set an appointment in the next few weeks. ?Take Pepcid daily for the next 2 weeks.  Return if new symptoms or worse ?

## 2022-02-15 NOTE — ED Notes (Signed)
Patient is being discharged from the Urgent Care and sent to the Emergency Department via EMS . Per NP, patient is in need of higher level of care due to chest pain. Patient is aware and verbalizes understanding of plan of care.  ?Vitals:  ? 02/15/22 1606  ?BP: (!) 142/90  ?Pulse: 75  ?Resp: 18  ?Temp: 98.5 ?F (36.9 ?C)  ?SpO2: 96%  ?  ?

## 2022-02-15 NOTE — ED Provider Notes (Addendum)
?[facility] ? ? ? ?CSN: [id number] ?Arrival date & time: 02/15/22  1602 ? ? ?  ? ?History   ?Chief Complaint ?Chief Complaint  ?Patient presents with  ? Chest Pain  ? ? ?HPI ?Kaylyn Layericholas A Mccalip is a 31 y.o. male.  ? ?The patient is a 31 year old male who presents with left-sided chest pain.  Patient states symptoms started approximately 1 month ago.  Over the last couple of days, he states pain has worsened.  Today he presents after developing an episode of diaphoresis and radiation of pain into the left arm.  He states that on yesterday he had dry heaves.  He rates his pain 7/10 at present.  He describes the pain as "chest pressure, tightness, and heaviness".  He denies any previous cardiac history.  He currently does not take any medication. ? ?The history is provided by the patient.  ?Chest Pain ?Pain location:  L chest ?Pain quality: tightness   ?Pain radiates to:  L arm ?Pain severity:  Moderate ?Associated symptoms: fatigue and shortness of breath   ? ?Past Medical History:  ?Diagnosis Date  ? GERD (gastroesophageal reflux disease)   ? Panic attacks   ? ? ?There are no problems to display for this patient. ? ? ?History reviewed. No pertinent surgical history. ? ? ? ? ?Home Medications   ? ?Prior to Admission medications   ?Medication Sig Start Date End Date Taking? Authorizing Provider  ?cyclobenzaprine (FLEXERIL) 10 MG tablet Take 1 tablet (10 mg total) by mouth 3 (three) times daily as needed for muscle spasms. 01/17/22   Bethann BerkshireZammit, Joseph, MD  ?ibuprofen (ADVIL) 800 MG tablet Take 1 tablet (800 mg total) by mouth every 8 (eight) hours as needed for moderate pain. 01/17/22   Bethann BerkshireZammit, Joseph, MD  ?cetirizine (ZYRTEC) 10 MG tablet Take 1 tablet (10 mg total) by mouth daily. 07/04/19 11/30/20  Bill SalinasMorelli, Brandon A, PA-C  ? ? ?Family History ?Family History  ?Problem Relation Age of Onset  ? Diabetes Other   ? ? ?Social History ?Social History  ? ?Tobacco Use  ? Smoking status: Former  ?  Types: E-cigarettes   ? Smokeless tobacco: Former  ?  Types: Chew  ? Tobacco comments:  ?  2 cigarettes daily   ?Vaping Use  ? Vaping Use: Every day  ? Substances: Nicotine  ?Substance Use Topics  ? Alcohol use: Yes  ?  Alcohol/week: 5.0 standard drinks  ?  Types: 5 Cans of beer per week  ?  Comment: occasionally  ? Drug use: Yes  ?  Types: Marijuana  ?  Comment: Delta 8  ? ? ? ?Allergies   ?Cinnamon, Chocolate, and Sunflower oil ? ? ?Review of Systems ?Review of Systems  ?Constitutional:  Positive for fatigue.  ?Eyes: Negative.   ?Respiratory:  Positive for chest tightness and shortness of breath.   ?Cardiovascular:  Positive for chest pain.  ?Gastrointestinal: Negative.   ?Skin: Negative.   ?Psychiatric/Behavioral: Negative.    ? ? ?Physical Exam ?Triage Vital Signs ?ED Triage Vitals  ?Enc Vitals Group  ?   BP 02/15/22 1606 (!) 142/90  ?   Pulse Rate 02/15/22 1606 75  ?   Resp 02/15/22 1606 18  ?   Temp 02/15/22 1606 98.5 ?F (36.9 ?C)  ?   Temp Source 02/15/22 1606 Oral  ?   SpO2 02/15/22 1606 96 %  ?   Weight 02/15/22 1607 210 lb (95.3 kg)  ?   Height 02/15/22 1607 5\' 7"  (  1.702 m)  ?   Head Circumference --   ?   Peak Flow --   ?   Pain Score 02/15/22 1606 7  ?   Pain Loc --   ?   Pain Edu? --   ?   Excl. in GC? --   ? ?No data found. ? ?Updated Vital Signs ?BP (!) 142/90 (BP Location: Right Arm)   Pulse 75   Temp 98.5 ?F (36.9 ?C) (Oral)   Resp 18   Ht 5\' 7"  (1.702 m)   Wt 210 lb (95.3 kg)   SpO2 96%   BMI 32.89 kg/m?  ? ?Visual Acuity ?Right Eye Distance:   ?Left Eye Distance:   ?Bilateral Distance:   ? ?Right Eye Near:   ?Left Eye Near:    ?Bilateral Near:    ? ?Physical Exam ?Vitals reviewed.  ?Constitutional:   ?   General: He is not in acute distress. ?   Appearance: He is well-developed.  ?HENT:  ?   Head: Normocephalic.  ?Cardiovascular:  ?   Rate and Rhythm: Normal rate and regular rhythm.  ?   Heart sounds: Normal heart sounds.  ?Pulmonary:  ?   Effort: Pulmonary effort is normal.  ?   Breath sounds: Normal breath  sounds.  ?Musculoskeletal:     ?   General: Normal range of motion.  ?   Cervical back: Normal range of motion and neck supple.  ?Skin: ?   General: Skin is warm and dry.  ?   Capillary Refill: Capillary refill takes less than 2 seconds.  ?Neurological:  ?   General: No focal deficit present.  ?   Mental Status: He is alert and oriented to person, place, and time.  ?Psychiatric:     ?   Mood and Affect: Mood normal.     ?   Behavior: Behavior normal.  ? ? ? ?UC Treatments / Results  ?Labs ?(all labs ordered are listed, but only abnormal results are displayed) ?Labs Reviewed - No data to display ? ?EKG: Elevated T-waves in V2, changes noted when compared to ECG on 11/22 ? ? ?Radiology ?No results found. ? ?Procedures ?Procedures (including critical care time) ? ?Medications Ordered in UC ?Medications  ?aspirin chewable tablet 324 mg (324 mg Oral Given 02/15/22 1646)  ? ? ?Initial Impression / Assessment and Plan / UC Course  ?I have reviewed the triage vital signs and the nursing notes. ? ?Pertinent labs & imaging results that were available during my care of the patient were reviewed by me and considered in my medical decision making (see chart for details). ? ?The patient is a 31 year old male who presents for left-sided chest pain.  Pain has persisted for the past month, but over the past 48 hours pain has persisted.  Today, the patient reports after he had an episode of diaphoresis and radiation of pain down the left arm.  On exam, he states the pain feels like "a heaviness or pressure in the chest".  His vital signs are stable, his exam is normal, advised patient I am going to send him to the ER due to his worsening of pain over the past 48 hours and the complaints of the radiation of pain.  Based on his previous EKG in November, 2022, there appeared to be some elevated T waves in V2 when compared to the previous EKG.  Cannot rule out MI in this setting.  Patient advised that he will be sent for further  evaluation  and work-up.  Patient was given aspirin 325 mg in our clinic.  EMS was called, patient was transported. ?Final Clinical Impressions(s) / UC Diagnoses  ? ?Final diagnoses:  ?None  ? ? ? ?Discharge Instructions   ? ?  ?Patient sent to the ER via EMS for further evaluation of left-sided chest pain. ? ? ? ? ?ED Prescriptions   ?None ?  ? ?PDMP not reviewed this encounter. ?  ?Abran Cantor, NP ?02/15/22 1646 ? ?  ?Abran Cantor, NP ?02/15/22 1647 ? ?

## 2022-02-15 NOTE — ED Triage Notes (Addendum)
Pt reports intermittent left sided chest pain x1 month. Pt reports exacerbation of pain since yesterday.pt reports pain is constant. reports intermittent shortness of breath, left arm heaviness. Pt reports deep breath lessens pain some but quickly returns. ? ?Pt reports approximately noon today suddenly broke out in sweat and reports increase in chest pain and left arm numbness. Pt reports episode lasted for "awhile." ?

## 2022-02-15 NOTE — ED Notes (Signed)
Patient verbalizes understanding of discharge instructions. Opportunity for questioning and answers were provided. Armband removed by staff, pt discharged from ED. Ambulated out to lobby  

## 2022-12-30 ENCOUNTER — Encounter (HOSPITAL_COMMUNITY): Payer: Self-pay

## 2022-12-30 ENCOUNTER — Other Ambulatory Visit: Payer: Self-pay

## 2022-12-30 ENCOUNTER — Emergency Department (HOSPITAL_COMMUNITY)
Admission: EM | Admit: 2022-12-30 | Discharge: 2022-12-30 | Disposition: A | Discharge status: Home / Self Care | Payer: PRIVATE HEALTH INSURANCE | Attending: Emergency Medicine | Admitting: Emergency Medicine

## 2022-12-30 DIAGNOSIS — R109 Unspecified abdominal pain: Secondary | ICD-10-CM

## 2022-12-30 DIAGNOSIS — R1013 Epigastric pain: Secondary | ICD-10-CM | POA: Insufficient documentation

## 2022-12-30 MED ORDER — PANTOPRAZOLE SODIUM 40 MG PO TBEC
40.0000 mg | DELAYED_RELEASE_TABLET | Freq: Every day | ORAL | Status: DC
  Administered 2022-12-30: 40 mg via ORAL
  Filled 2022-12-30: qty 1

## 2022-12-30 MED ORDER — PANTOPRAZOLE SODIUM 20 MG PO TBEC: 20.0000 mg | DELAYED_RELEASE_TABLET | Freq: Every day | ORAL | 0 refills | Status: AC

## 2022-12-30 NOTE — ED Provider Notes (Signed)
[facility] Provider Note   CSN: [id number] Arrival date & time: 12/30/22  1653     History  Chief Complaint  Patient presents with   Abdominal Pain   HPI Derek White is a 32 y.o. male presenting for abdominal pain.  Has been going on for a couple months but was worse this morning.  It is located in the epigastric region.  It is nonradiating.  Denies nausea vomiting diarrhea.  Last bowel movement was this morning.  No changes to urine.  No fever.  Patient requesting a work note.  At this time patient states that he is not having abdominal pain but wanted to be checked out.  States he does have reflux from time to time. Denies chest pain and shortness of breath.    Abdominal Pain      Home Medications Prior to Admission medications   Medication Sig Start Date End Date Taking? Authorizing Provider  pantoprazole (PROTONIX) 20 MG tablet Take 1 tablet (20 mg total) by mouth daily. 12/30/22 01/29/23 Yes Harriet Pho, PA-C  cyclobenzaprine (FLEXERIL) 10 MG tablet Take 1 tablet (10 mg total) by mouth 3 (three) times daily as needed for muscle spasms. Patient not taking: Reported on 02/15/2022 01/17/22   Milton Ferguson, MD  famotidine (PEPCID) 20 MG tablet Take 1 tablet (20 mg total) by mouth 2 (two) times daily. 02/15/22   Sherrill Raring, PA-C  ibuprofen (ADVIL) 800 MG tablet Take 1 tablet (800 mg total) by mouth every 8 (eight) hours as needed for moderate pain. 01/17/22   Milton Ferguson, MD  cetirizine (ZYRTEC) 10 MG tablet Take 1 tablet (10 mg total) by mouth daily. 07/04/19 11/30/20  Deliah Boston, PA-C      Allergies    Cinnamon, Chocolate, and Sunflower oil    Review of Systems   Review of Systems  Gastrointestinal:  Positive for abdominal pain.    Physical Exam Updated Vital Signs BP (!) 141/88 (BP Location: Right Arm)   Pulse 65   Temp 98.1 F (36.7 C) (Oral)   Ht '5\' 7"'$  (1.702 m)   Wt 95.3 kg   SpO2 99%   BMI 32.89  kg/m  Physical Exam Vitals and nursing note reviewed.  HENT:     Head: Normocephalic and atraumatic.     Mouth/Throat:     Mouth: Mucous membranes are moist.  Eyes:     General:        Right eye: No discharge.        Left eye: No discharge.     Conjunctiva/sclera: Conjunctivae normal.  Cardiovascular:     Rate and Rhythm: Normal rate and regular rhythm.     Pulses: Normal pulses.     Heart sounds: Normal heart sounds.  Pulmonary:     Effort: Pulmonary effort is normal.     Breath sounds: Normal breath sounds.  Abdominal:     General: Abdomen is flat. There is no distension.     Palpations: Abdomen is soft.     Tenderness: There is no abdominal tenderness.  Skin:    General: Skin is warm and dry.  Neurological:     General: No focal deficit present.  Psychiatric:        Mood and Affect: Mood normal.     ED Results / Procedures / Treatments   Labs (all labs ordered are listed, but only abnormal results are displayed) Labs Reviewed - No data to display  EKG None  Radiology No results found.  Procedures Procedures    Medications Ordered in ED Medications  pantoprazole (PROTONIX) EC tablet 40 mg (has no administration in time range)    ED Course/ Medical Decision Making/ A&P                             Medical Decision Making  32 year old male who is well-appearing presenting for epigastric pain.  Patient was asymptomatic during encounter.  Exam of his abdomen was unremarkable.  Considered intra-abdominal infection but unlikely given no fever and no pertinent symptoms.  Symptoms inconsistent with ACS but did consider.  Started him on Protonix for likely reflux.  Advised him to follow-up with his PCP.  Discussed return precautions.        Final Clinical Impression(s) / ED Diagnoses Final diagnoses:  Abdominal pain, unspecified abdominal location    Rx / DC Orders ED Discharge Orders          Ordered    pantoprazole (PROTONIX) 20 MG tablet  Daily         12/30/22 1837              Harriet Pho, PA-C 12/30/22 1839    Hayden Rasmussen, MD 12/31/22 1037

## 2022-12-30 NOTE — ED Triage Notes (Signed)
Pt reports severe abdominal pain this morning causing him to call out of work that has gotten some better. Denies N/V/D

## 2022-12-30 NOTE — Discharge Instructions (Signed)
Evaluation today revealed that you likely have reflux.  I am starting you on a PPI called Protonix which will help to keep the acid level lower in your stomach and likely prevent reflux.  Please return if you have worsening abdominal pain, nausea, vomiting, diarrhea or blood in your stool, blood in your urine or any other concerning symptom.  Otherwise recommend that you establish care with PCP and follow-up for your intermittent abdominal pain.

## 2024-03-29 IMAGING — DX DG LUMBAR SPINE COMPLETE 4+V
5 series · 5 of 5 positions shown · non-contrast
Comparison: None.

CLINICAL DATA: Lower back and bilateral lower extremity pain
without known injury.

EXAM:
LUMBAR SPINE - COMPLETE 4+ VIEW

[l-spine ap]
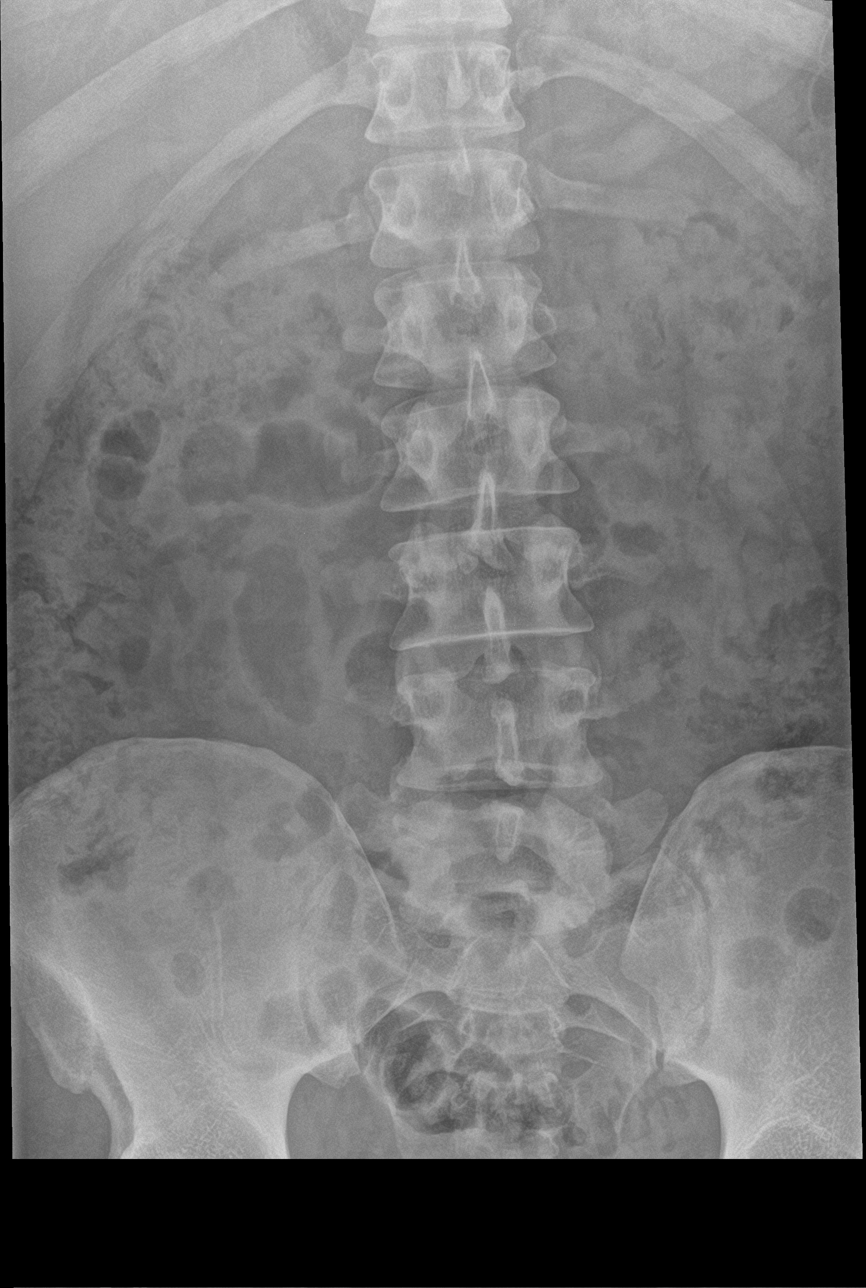

[l-spine obl (1 of 2)]
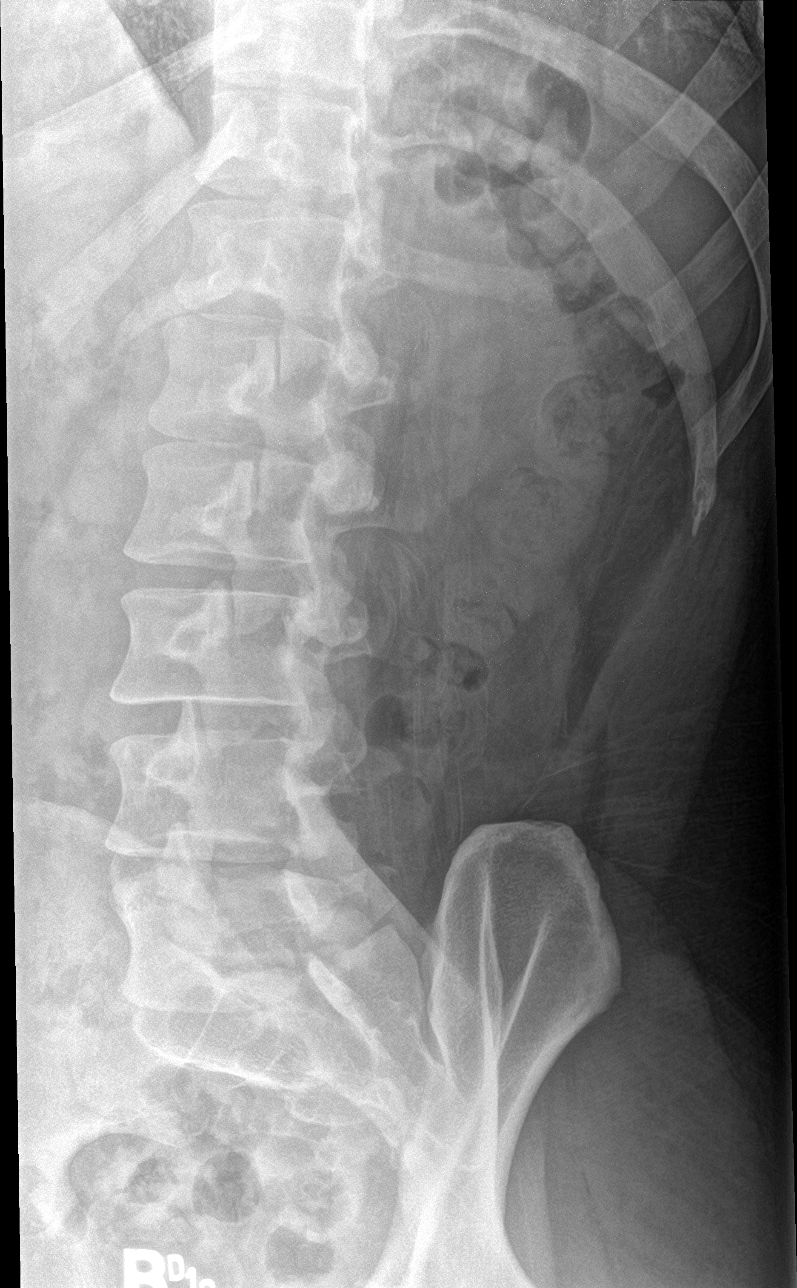

[l-spine obl (2 of 2)]
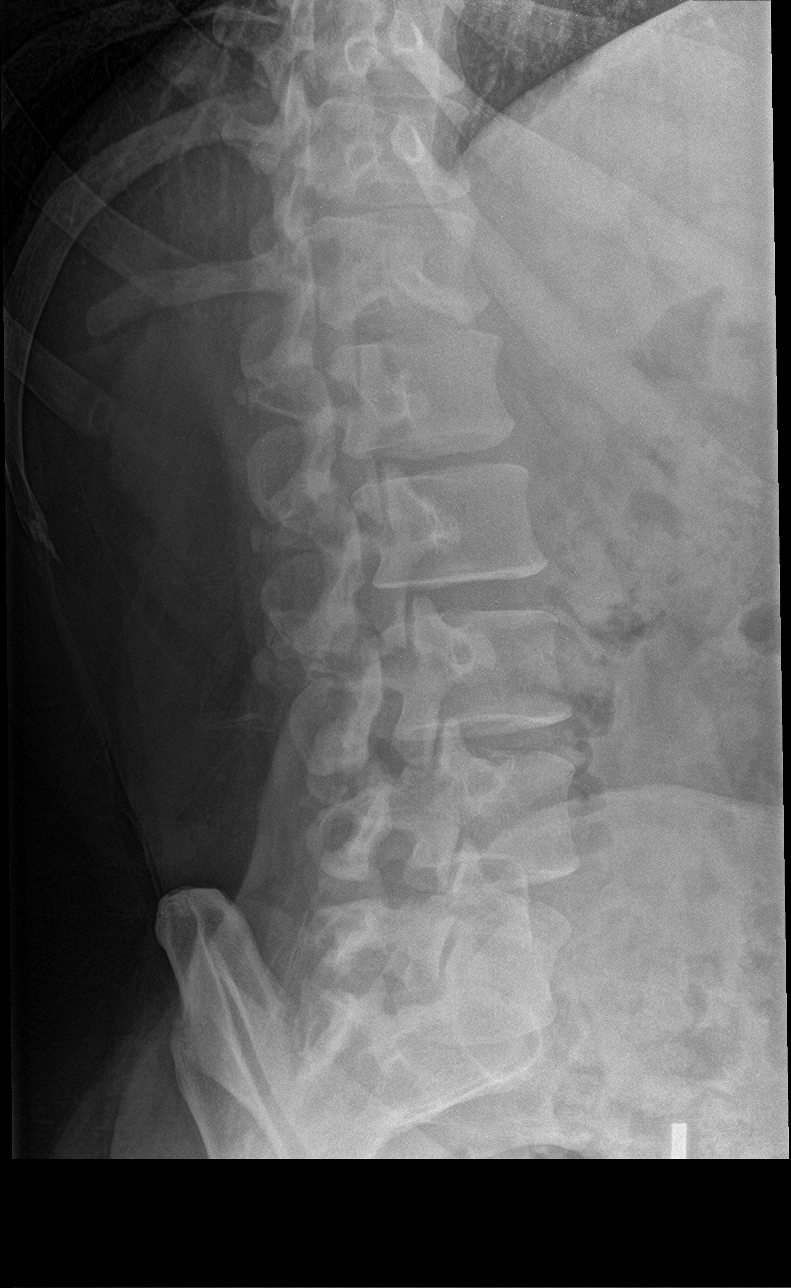

[l-spine lat]
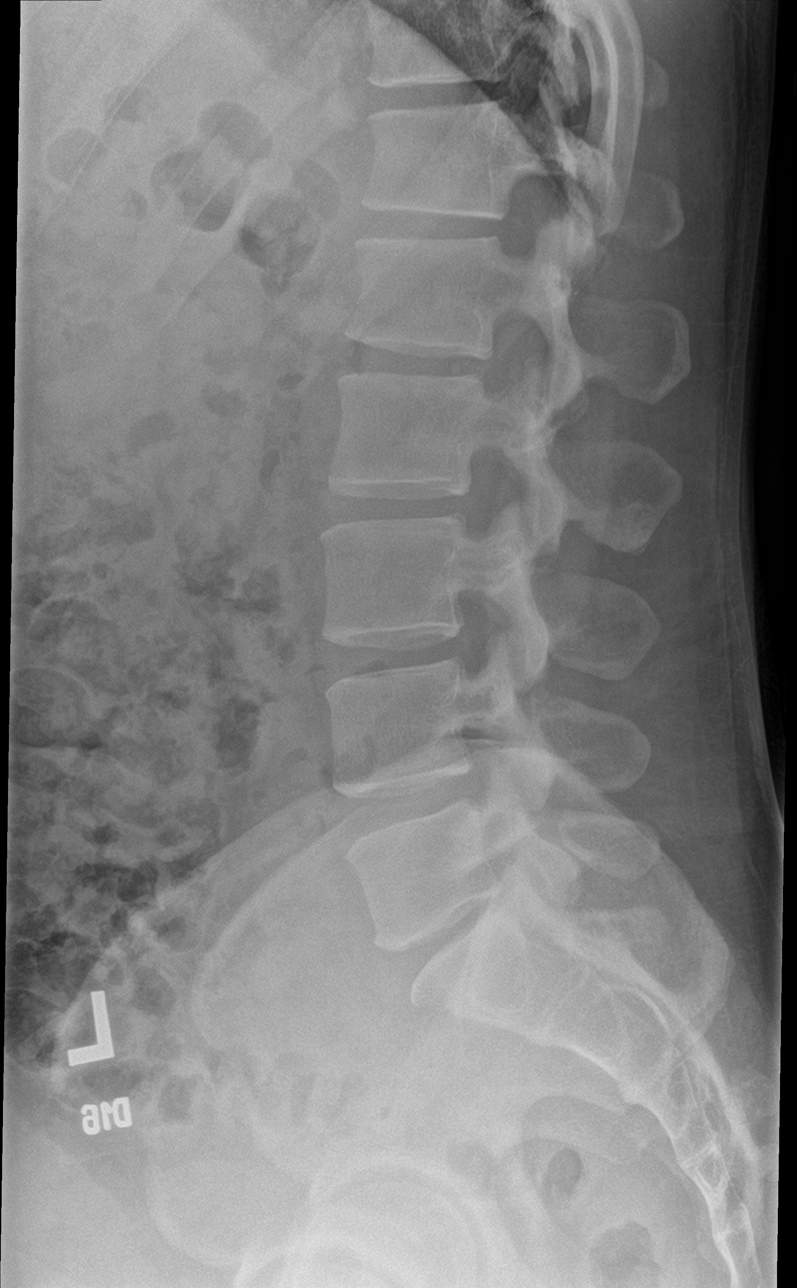

[l-spine spot]
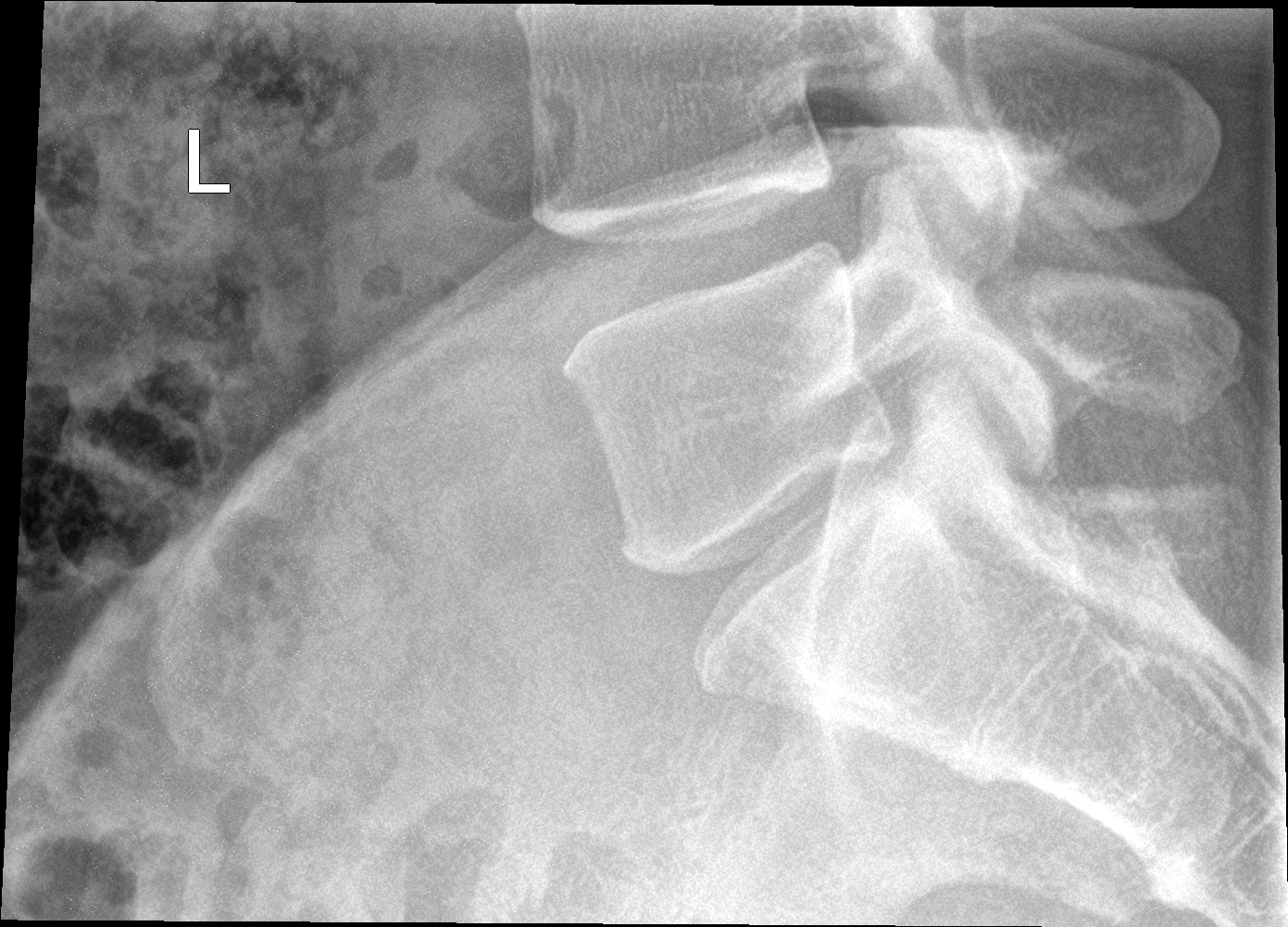

[5 of 5 positions shown; findings below may reference images not displayed]

FINDINGS: There is no evidence of lumbar spine fracture. Alignment is normal.
Intervertebral disc spaces are maintained.
IMPRESSION: Negative.

## 2024-04-27 IMAGING — DX DG CHEST 2V
2 series · 2 of 2 positions shown · non-contrast
Comparison: 09/11/2021

CLINICAL DATA: Chest pain. Left arm heaviness. Shortness of breath.

EXAM:
CHEST - 2 VIEW

[chest pa]
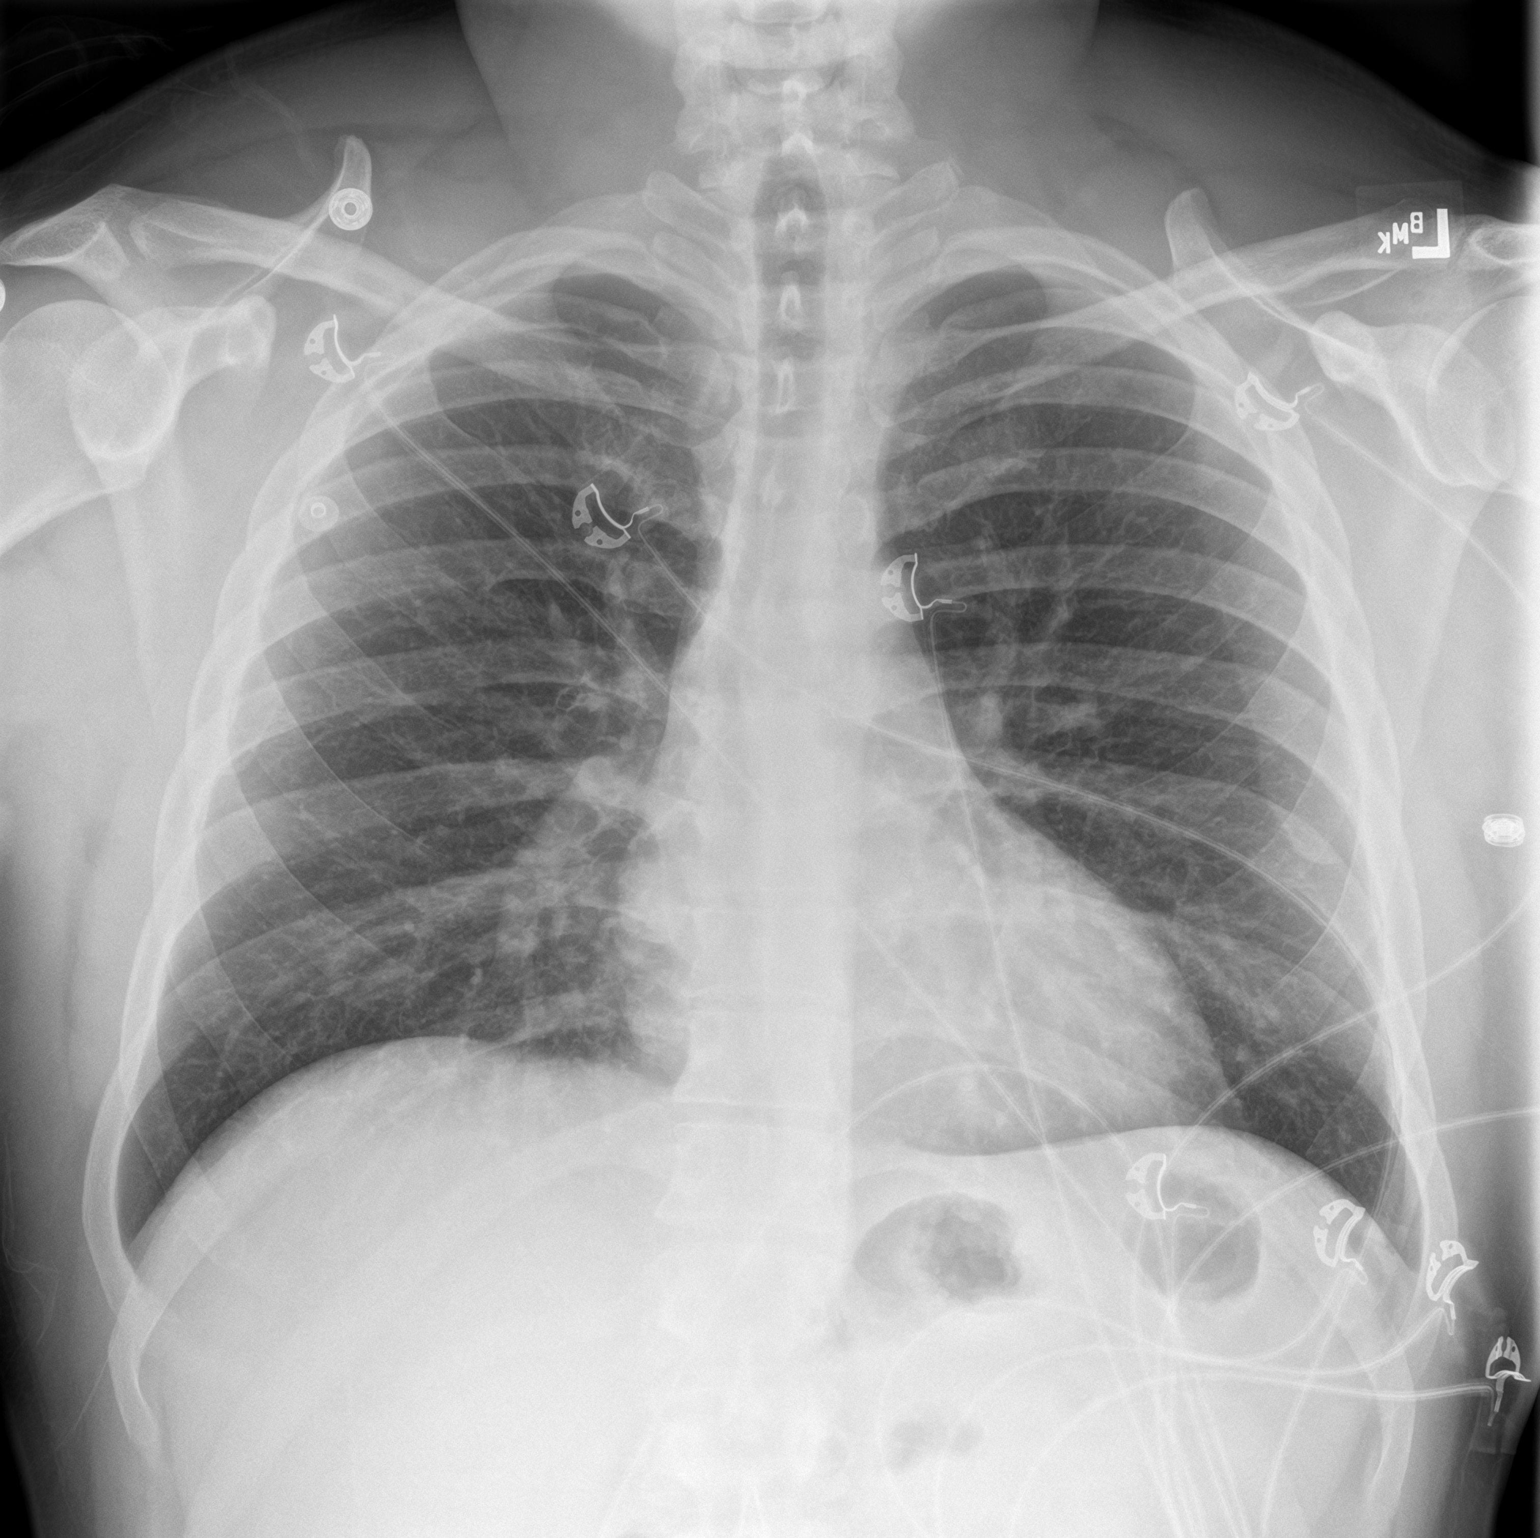

[chest lat]
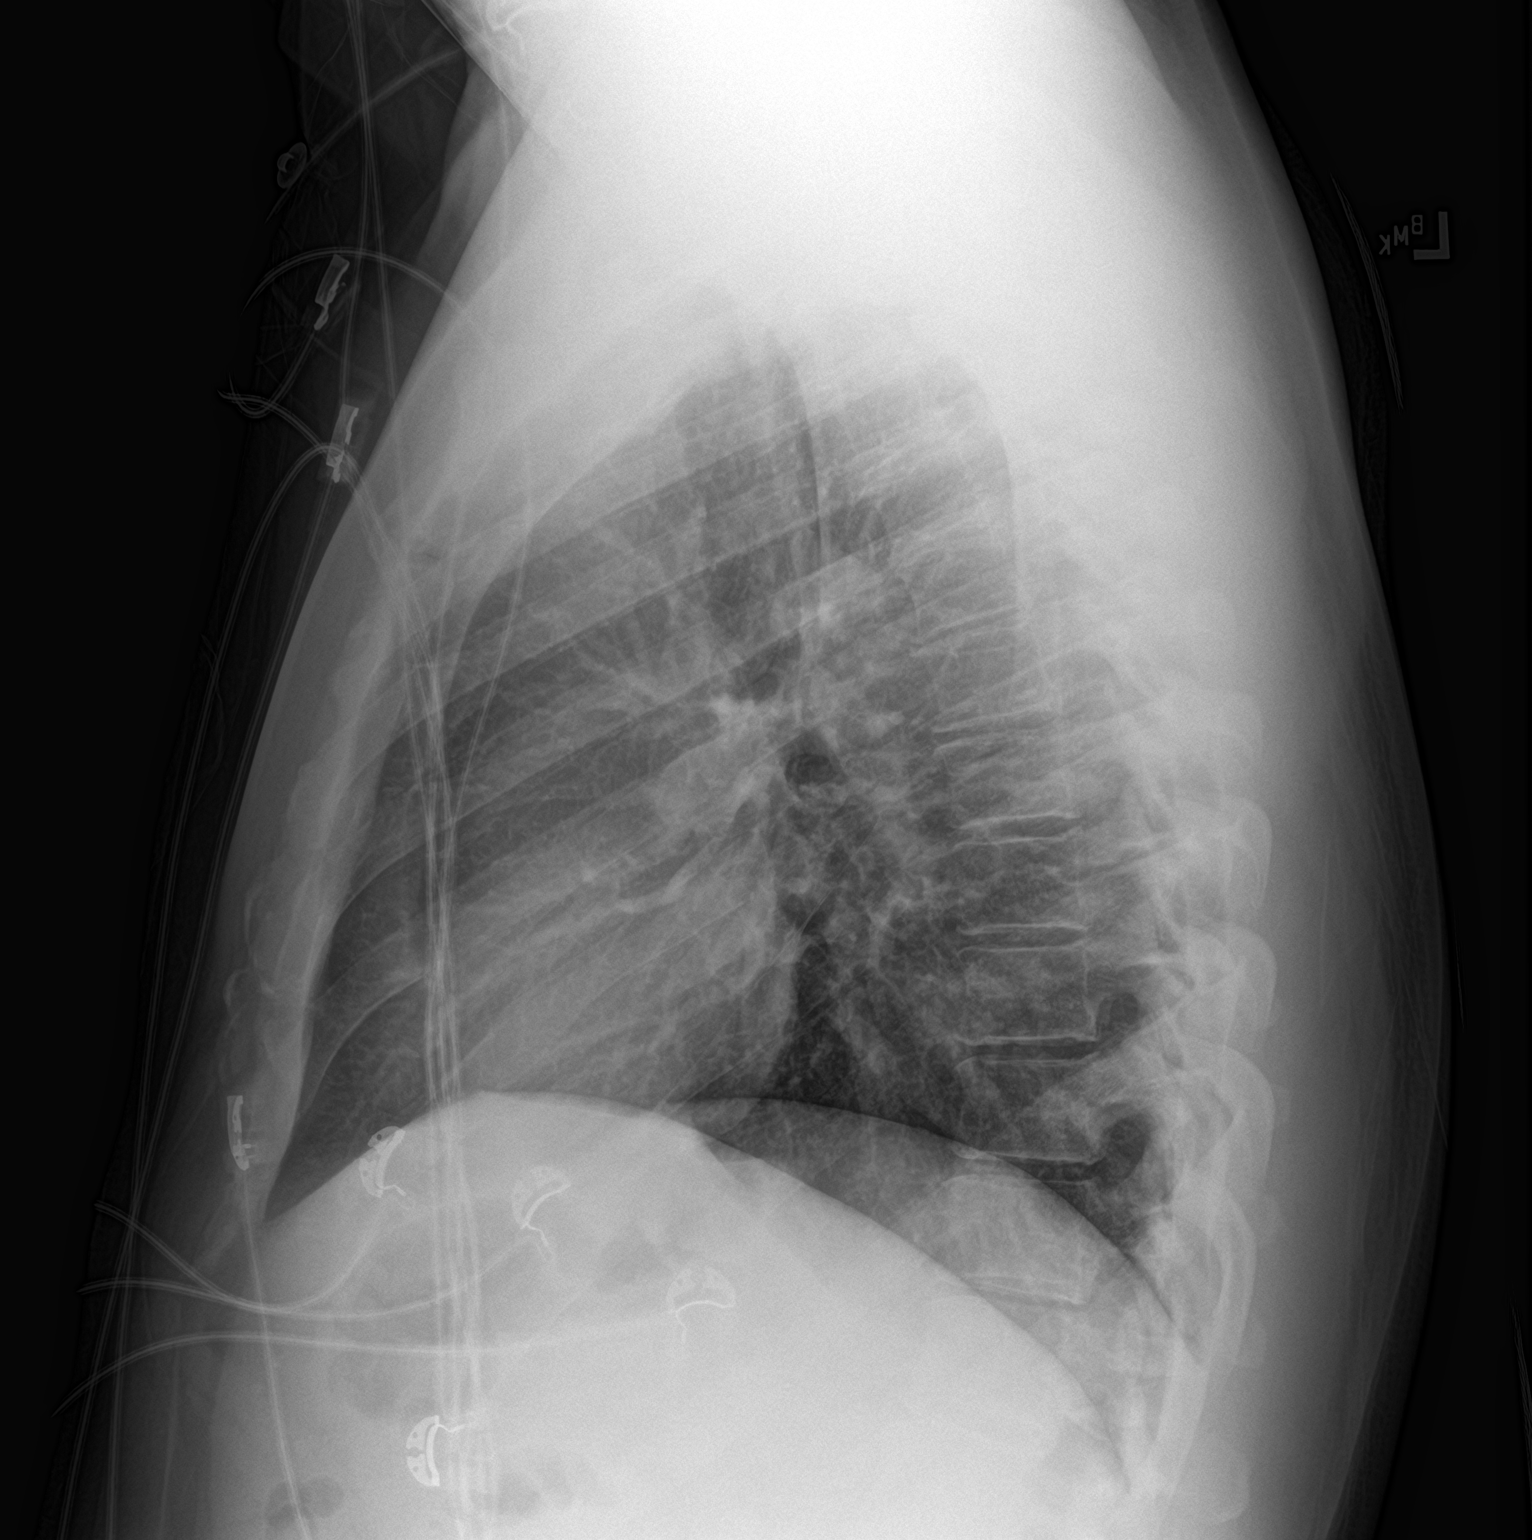

[2 of 2 positions shown; findings below may reference images not displayed]

FINDINGS: The cardiomediastinal contours are normal. Minor bibasilar
atelectasis. Pulmonary vasculature is normal. No consolidation,
pleural effusion, or pneumothorax. No acute osseous abnormalities
are seen.
IMPRESSION: Minor bibasilar atelectasis.

## 2024-06-18 ENCOUNTER — Encounter: Payer: Self-pay | Admitting: Emergency Medicine

## 2024-06-18 ENCOUNTER — Ambulatory Visit
Admission: EM | Admit: 2024-06-18 | Discharge: 2024-06-18 | Disposition: A | Discharge status: Home / Self Care | Payer: PRIVATE HEALTH INSURANCE | Attending: Nurse Practitioner | Admitting: Nurse Practitioner

## 2024-06-18 ENCOUNTER — Other Ambulatory Visit: Payer: Self-pay

## 2024-06-18 DIAGNOSIS — R0981 Nasal congestion: Secondary | ICD-10-CM

## 2024-06-18 LAB — POC SOFIA SARS ANTIGEN FIA: SARS Coronavirus 2 Ag: NEGATIVE

## 2024-06-18 MED ORDER — FLUTICASONE PROPIONATE 50 MCG/ACT NA SUSP: 2.0000 | Freq: Every day | NASAL | 0 refills | Status: AC

## 2024-06-18 NOTE — ED Triage Notes (Addendum)
 Pt reports nasal congestion, loss of taste/smell, chills since Monday. Reports symptoms improved but now states intermittent shortness of breath. Denies any known fevers, gi/gu symptoms. NAD noted. Reports felt similar with covid in 2021.

## 2024-06-18 NOTE — ED Provider Notes (Signed)
 [facility]    CSN: [id number] Arrival date & time: 06/18/24  1826      History   Chief Complaint Chief Complaint  Patient presents with   Chills    HPI Derek White is a 33 y.o. male.   The history is provided by the patient.   Patient presents with a several day history of nasal congestion, loss of taste and smell, and chills.  Patient also endorses intermittent shortness of breath, but that has since improved.  He denies fever, chills, headache, ear pain, ear drainage, cough, wheezing, difficulty breathing, abdominal pain, nausea, vomiting, diarrhea, or rash.  Patient is requesting a COVID test, states his symptoms feel similar to when he had COVID several years ago.  So far he has not taken any medications for his symptoms.  Reports that his son was recently diagnosed with COVID.  Past Medical History:  Diagnosis Date   GERD (gastroesophageal reflux disease)    Panic attacks     There are no active problems to display for this patient.   History reviewed. No pertinent surgical history.     Home Medications    Prior to Admission medications   Medication Sig Start Date End Date Taking? Authorizing Provider  fluticasone  (FLONASE ) 50 MCG/ACT nasal spray Place 2 sprays into both nostrils daily. 06/18/24  Yes Leath-Warren, Etta PARAS, NP  cyclobenzaprine  (FLEXERIL ) 10 MG tablet Take 1 tablet (10 mg total) by mouth 3 (three) times daily as needed for muscle spasms. Patient not taking: Reported on 02/15/2022 01/17/22   Zammit, Joseph, MD  famotidine  (PEPCID ) 20 MG tablet Take 1 tablet (20 mg total) by mouth 2 (two) times daily. 02/15/22   Emelia Sluder, PA-C  ibuprofen  (ADVIL ) 800 MG tablet Take 1 tablet (800 mg total) by mouth every 8 (eight) hours as needed for moderate pain. 01/17/22   Zammit, Joseph, MD  pantoprazole  (PROTONIX ) 20 MG tablet Take 1 tablet (20 mg total) by mouth daily. 12/30/22 01/29/23  Robinson, John K, PA-C  cetirizine  (ZYRTEC ) 10 MG tablet  Take 1 tablet (10 mg total) by mouth daily. 07/04/19 11/30/20  Donah Penne DELENA, PA-C    Family History Family History  Problem Relation Age of Onset   Diabetes Other     Social History Social History   Tobacco Use   Smoking status: Former    Types: E-cigarettes   Smokeless tobacco: Former    Types: Chew   Tobacco comments:    2 cigarettes daily   Vaping Use   Vaping status: Every Day   Substances: Nicotine  Substance Use Topics   Alcohol use: Yes    Alcohol/week: 5.0 standard drinks of alcohol    Types: 5 Cans of beer per week    Comment: occasionally   Drug use: Yes    Types: Marijuana    Comment: CBD, Hemp     Allergies   Cinnamon and Chocolate   Review of Systems Review of Systems Per HPI  Physical Exam Triage Vital Signs ED Triage Vitals [06/18/24 1852]  Encounter Vitals Group     BP (!) 146/86     Girls Systolic BP Percentile      Girls Diastolic BP Percentile      Boys Systolic BP Percentile      Boys Diastolic BP Percentile      Pulse Rate 70     Resp 20     Temp 98 F (36.7 C)     Temp Source Oral  SpO2 96 %     Weight      Height      Head Circumference      Peak Flow      Pain Score 0     Pain Loc      Pain Education      Exclude from Growth Chart    No data found.  Updated Vital Signs BP (!) 146/86 (BP Location: Left Arm)   Pulse 70   Temp 98 F (36.7 C) (Oral)   Resp 20   SpO2 96%   Visual Acuity Right Eye Distance:   Left Eye Distance:   Bilateral Distance:    Right Eye Near:   Left Eye Near:    Bilateral Near:     Physical Exam Vitals and nursing note reviewed.  Constitutional:      General: He is not in acute distress.    Appearance: Normal appearance.  HENT:     Head: Normocephalic.     Right Ear: Tympanic membrane, ear canal and external ear normal.     Left Ear: Tympanic membrane, ear canal and external ear normal.     Nose: Congestion present.     Right Turbinates: Enlarged and swollen.     Left  Turbinates: Enlarged and swollen.     Right Sinus: No maxillary sinus tenderness or frontal sinus tenderness.     Left Sinus: No maxillary sinus tenderness or frontal sinus tenderness.     Mouth/Throat:     Lips: Pink.     Mouth: Mucous membranes are moist.     Pharynx: Oropharynx is clear. Uvula midline.  Eyes:     Extraocular Movements: Extraocular movements intact.     Conjunctiva/sclera: Conjunctivae normal.     Pupils: Pupils are equal, round, and reactive to light.  Cardiovascular:     Rate and Rhythm: Normal rate and regular rhythm.     Pulses: Normal pulses.     Heart sounds: Normal heart sounds.  Pulmonary:     Effort: Pulmonary effort is normal. No respiratory distress.     Breath sounds: Normal breath sounds. No stridor. No wheezing, rhonchi or rales.  Abdominal:     General: Bowel sounds are normal.     Palpations: Abdomen is soft.     Tenderness: There is no abdominal tenderness.  Musculoskeletal:     Cervical back: Normal range of motion.  Skin:    General: Skin is warm and dry.  Neurological:     General: No focal deficit present.     Mental Status: He is alert and oriented to person, place, and time.  Psychiatric:        Mood and Affect: Mood normal.        Behavior: Behavior normal.      UC Treatments / Results  Labs (all labs ordered are listed, but only abnormal results are displayed) Labs Reviewed  POC SOFIA SARS ANTIGEN FIA    EKG   Radiology No results found.  Procedures Procedures (including critical care time)  Medications Ordered in UC Medications - No data to display  Initial Impression / Assessment and Plan / UC Course  I have reviewed the triage vital signs and the nursing notes.  Pertinent labs & imaging results that were available during my care of the patient were reviewed by me and considered in my medical decision making (see chart for details).  COVID test was negative.  Will provide treatment with fluticasone  50 micro  nasal spray for nasal  congestion and runny nose.  Supportive care recommendations were provided and discussed with the patient to include fluids, rest, over-the-counter analgesics, and use of normal saline nasal spray for nasal congestion.  Discussed indications with patient regarding follow-up.  Patient was in agreement with this plan of care and verbalizes understanding.  All questions were answered.  Patient stable for discharge.   Final Clinical Impressions(s) / UC Diagnoses   Final diagnoses:  Nasal congestion     Discharge Instructions      Your COVID test was negative. Take medication as prescribed. Increase fluids and allow for plenty of rest. You may take over-the-counter Tylenol  or ibuprofen  as needed for pain, fever, or general discomfort. You may use normal saline nasal spray throughout the day for nasal congestion and runny nose. Your symptoms should begin improving over the next 5 to 7 days.  If symptoms fail to improve, or appear to be worsening, you may follow-up in this clinic or with your primary care physician for further evaluation. Follow-up as needed.     ED Prescriptions     Medication Sig Dispense Auth. Provider   fluticasone  (FLONASE ) 50 MCG/ACT nasal spray Place 2 sprays into both nostrils daily. 16 g Leath-Warren, Etta PARAS, NP      PDMP not reviewed this encounter.   Gilmer Etta PARAS, NP 06/18/24 [facility]

## 2024-06-18 NOTE — Discharge Instructions (Signed)
 Your COVID test was negative. Take medication as prescribed. Increase fluids and allow for plenty of rest. You may take over-the-counter Tylenol  or ibuprofen  as needed for pain, fever, or general discomfort. You may use normal saline nasal spray throughout the day for nasal congestion and runny nose. Your symptoms should begin improving over the next 5 to 7 days.  If symptoms fail to improve, or appear to be worsening, you may follow-up in this clinic or with your primary care physician for further evaluation. Follow-up as needed.

## 2024-10-11 ENCOUNTER — Ambulatory Visit
Admission: EM | Admit: 2024-10-11 | Discharge: 2024-10-12 | Disposition: A | Discharge status: Home / Self Care | Payer: PRIVATE HEALTH INSURANCE | Attending: Family Medicine | Admitting: Family Medicine

## 2024-10-11 DIAGNOSIS — J101 Influenza due to other identified influenza virus with other respiratory manifestations: Secondary | ICD-10-CM

## 2024-10-11 LAB — POC COVID19/FLU A&B COMBO
Covid Antigen, POC: NEGATIVE
Influenza A Antigen, POC: POSITIVE — AB
Influenza B Antigen, POC: NEGATIVE

## 2024-10-11 MED ORDER — AZELASTINE HCL 0.1 % NA SOLN: 1.0000 | Freq: Two times a day (BID) | NASAL | 0 refills | Status: AC

## 2024-10-11 MED ORDER — OSELTAMIVIR PHOSPHATE 75 MG PO CAPS: 75.0000 mg | ORAL_CAPSULE | Freq: Two times a day (BID) | ORAL | 0 refills | Status: AC

## 2024-10-11 MED ORDER — PROMETHAZINE-DM 6.25-15 MG/5ML PO SYRP: 5.0000 mL | ORAL_SOLUTION | Freq: Four times a day (QID) | ORAL | 0 refills | Status: AC | PRN

## 2024-10-11 NOTE — ED Triage Notes (Signed)
 Cough, headache, body aches, fever 104 that started yesterday. Taking mucinex.

## 2024-10-12 DIAGNOSIS — J101 Influenza due to other identified influenza virus with other respiratory manifestations: Secondary | ICD-10-CM | POA: Diagnosis not present

## 2024-10-12 NOTE — ED Notes (Signed)
 Pt left in epic from 12/22 shift. Removed from epic at start of 12/23.

## 2024-10-12 NOTE — ED Provider Notes (Signed)
 " [facility]    CSN: [id number] Arrival date & time: 10/11/24  1932      History   Chief Complaint Chief Complaint  Patient presents with   Headache   Generalized Body Aches    HPI Derek White is a 33 y.o. male.   Patient presenting today with 1 day history of fever, body aches, headache, cough, runny nose, weakness.  Denies chest pain, shortness of breath, abdominal pain, vomiting, diarrhea.  So far tried Mucinex with minimal relief.  Multiple sick contacts recently.    Past Medical History:  Diagnosis Date   GERD (gastroesophageal reflux disease)    Panic attacks     There are no active problems to display for this patient.   History reviewed. No pertinent surgical history.     Home Medications    Prior to Admission medications  Medication Sig Start Date End Date Taking? Authorizing Provider  azelastine  (ASTELIN ) 0.1 % nasal spray Place 1 spray into both nostrils 2 (two) times daily. Use in each nostril as directed 10/11/24  Yes Stuart Vernell Norris, PA-C  oseltamivir  (TAMIFLU ) 75 MG capsule Take 1 capsule (75 mg total) by mouth every 12 (twelve) hours. 10/11/24  Yes Stuart Vernell Norris, PA-C  promethazine -dextromethorphan (PROMETHAZINE -DM) 6.25-15 MG/5ML syrup Take 5 mLs by mouth 4 (four) times daily as needed. 10/11/24  Yes Stuart Vernell Norris, PA-C  cyclobenzaprine  (FLEXERIL ) 10 MG tablet Take 1 tablet (10 mg total) by mouth 3 (three) times daily as needed for muscle spasms. Patient not taking: Reported on 02/15/2022 01/17/22   Zammit, Joseph, MD  famotidine  (PEPCID ) 20 MG tablet Take 1 tablet (20 mg total) by mouth 2 (two) times daily. 02/15/22   Emelia Sluder, PA-C  fluticasone  (FLONASE ) 50 MCG/ACT nasal spray Place 2 sprays into both nostrils daily. 06/18/24   Leath-Warren, Etta PARAS, NP  ibuprofen  (ADVIL ) 800 MG tablet Take 1 tablet (800 mg total) by mouth every 8 (eight) hours as needed for moderate pain. 01/17/22   Zammit, Joseph, MD   pantoprazole  (PROTONIX ) 20 MG tablet Take 1 tablet (20 mg total) by mouth daily. 12/30/22 01/29/23  Lang Norleen POUR, PA-C  cetirizine  (ZYRTEC ) 10 MG tablet Take 1 tablet (10 mg total) by mouth daily. 07/04/19 11/30/20  Donah Penne DELENA, PA-C    Family History Family History  Problem Relation Age of Onset   Diabetes Other     Social History Social History[1]   Allergies   Cinnamon and Chocolate   Review of Systems Review of Systems Per HPI  Physical Exam Triage Vital Signs ED Triage Vitals  Encounter Vitals Group     BP 10/11/24 1942 106/64     Girls Systolic BP Percentile --      Girls Diastolic BP Percentile --      Boys Systolic BP Percentile --      Boys Diastolic BP Percentile --      Pulse Rate 10/11/24 1942 76     Resp 10/11/24 1942 18     Temp 10/11/24 1942 99.3 F (37.4 C)     Temp Source 10/11/24 1942 Oral     SpO2 10/11/24 1942 98 %     Weight --      Height --      Head Circumference --      Peak Flow --      Pain Score 10/11/24 1944 8     Pain Loc --      Pain Education --  Exclude from Growth Chart --    No data found.  Updated Vital Signs BP 106/64 (BP Location: Right Arm)   Pulse 76   Temp 99.3 F (37.4 C) (Oral)   Resp 18   SpO2 98%   Visual Acuity Right Eye Distance:   Left Eye Distance:   Bilateral Distance:    Right Eye Near:   Left Eye Near:    Bilateral Near:     Physical Exam Vitals and nursing note reviewed.  Constitutional:      Appearance: He is well-developed.  HENT:     Head: Atraumatic.     Right Ear: External ear normal.     Left Ear: External ear normal.     Nose: Rhinorrhea present.     Mouth/Throat:     Pharynx: Posterior oropharyngeal erythema present. No oropharyngeal exudate.  Eyes:     Conjunctiva/sclera: Conjunctivae normal.     Pupils: Pupils are equal, round, and reactive to light.  Cardiovascular:     Rate and Rhythm: Normal rate and regular rhythm.  Pulmonary:     Effort: Pulmonary effort  is normal. No respiratory distress.     Breath sounds: No wheezing or rales.  Musculoskeletal:        General: Normal range of motion.     Cervical back: Normal range of motion and neck supple.  Lymphadenopathy:     Cervical: No cervical adenopathy.  Skin:    General: Skin is warm and dry.  Neurological:     Mental Status: He is alert and oriented to person, place, and time.  Psychiatric:        Behavior: Behavior normal.      UC Treatments / Results  Labs (all labs ordered are listed, but only abnormal results are displayed) Labs Reviewed  POC COVID19/FLU A&B COMBO - Abnormal; Notable for the following components:      Result Value   Influenza A Antigen, POC Positive (*)    All other components within normal limits    EKG   Radiology No results found.  Procedures Procedures (including critical care time)  Medications Ordered in UC Medications - No data to display  Initial Impression / Assessment and Plan / UC Course  I have reviewed the triage vital signs and the nursing notes.  Pertinent labs & imaging results that were available during my care of the patient were reviewed by me and considered in my medical decision making (see chart for details).     Rapid flu positive for influenza A.  Will treat with Tamiflu , Astelin , Phenergan  DM, supportive over-the-counter medications and home care.  Return for worsening or unresolving symptoms.  Work note given.  Final Clinical Impressions(s) / UC Diagnoses   Final diagnoses:  Influenza A   Discharge Instructions   None    ED Prescriptions     Medication Sig Dispense Auth. Provider   promethazine -dextromethorphan (PROMETHAZINE -DM) 6.25-15 MG/5ML syrup Take 5 mLs by mouth 4 (four) times daily as needed. 100 mL Stuart Vernell Norris, PA-C   oseltamivir  (TAMIFLU ) 75 MG capsule Take 1 capsule (75 mg total) by mouth every 12 (twelve) hours. 10 capsule Stuart Vernell Norris, PA-C   azelastine  (ASTELIN ) 0.1 % nasal  spray Place 1 spray into both nostrils 2 (two) times daily. Use in each nostril as directed 30 mL Stuart Vernell Norris, PA-C      PDMP not reviewed this encounter.    [1]  Social History Tobacco Use   Smoking status: Former  Types: E-cigarettes   Smokeless tobacco: Former    Types: Chew   Tobacco comments:    2 cigarettes daily   Vaping Use   Vaping status: Every Day   Substances: Nicotine  Substance Use Topics   Alcohol use: Yes    Alcohol/week: 5.0 standard drinks of alcohol    Types: 5 Cans of beer per week    Comment: occasionally   Drug use: Yes    Types: Marijuana    Comment: CBD, Hemp     Stuart Vernell Norris, [place] 10/12/24 1131  "

## 2024-10-13 ENCOUNTER — Ambulatory Visit
Admission: EM | Admit: 2024-10-13 | Discharge: 2024-10-13 | Disposition: A | Discharge status: Home / Self Care | Payer: PRIVATE HEALTH INSURANCE | Attending: Nurse Practitioner | Admitting: Nurse Practitioner

## 2024-10-13 DIAGNOSIS — J101 Influenza due to other identified influenza virus with other respiratory manifestations: Secondary | ICD-10-CM

## 2024-10-13 MED ORDER — ONDANSETRON 4 MG PO TBDP: 4.0000 mg | ORAL_TABLET | Freq: Three times a day (TID) | ORAL | 0 refills | Status: AC | PRN

## 2024-10-13 NOTE — Discharge Instructions (Signed)
 Take medication as prescribed. Continue the cough medicine you were previously prescribed along with the nasal spray.  This will help with the feeling of fullness in your ears. Recommend normal saline nasal spray throughout the day for nasal congestion or runny nose. For your cough, recommend use of a humidifier in your bedroom at nighttime during sleep and sleeping elevated on pillows while symptoms persist. Recommend over-the-counter Tylenol  or ibuprofen  as needed for pain, fever, or general discomfort. Symptoms should begin to improve over the next 5 to 7 days.  If symptoms fail to improve, or begin to worsen, please follow-up with your primary care physician for further evaluation. Follow-up as needed.

## 2024-10-13 NOTE — ED Triage Notes (Signed)
 Pt reports he has a sore throat, body aches, n/v, x 3 days

## 2024-10-13 NOTE — ED Provider Notes (Signed)
 " [facility]    CSN: [id number] Arrival date & time: 10/13/24  1542      History   Chief Complaint No chief complaint on file.   HPI Derek White is a 33 y.o. male.   The history is provided by the patient.   Patient presents for follow-up for influenza.  Patient was seen in this clinic on 10/11/2024.  He was prescribed Tamiflu , Promethazine  DM, and azelastine  nasal spray.  Patient states that he did not start the Tamiflu  until today.  He continues to complain of nausea, vomiting, fever, and bodyaches.  States his fever was around 100 today.  States that he has not taken any medications such as Tylenol  or ibuprofen  for fever control.  States that he has been unable to eat.  He denies headache, dizziness, lightheadedness, wheezing, difficulty breathing, abdominal pain, diarrhea, or rash.  Past Medical History:  Diagnosis Date   GERD (gastroesophageal reflux disease)    Panic attacks     There are no active problems to display for this patient.   History reviewed. No pertinent surgical history.     Home Medications    Prior to Admission medications  Medication Sig Start Date End Date Taking? Authorizing Provider  ondansetron  (ZOFRAN -ODT) 4 MG disintegrating tablet Take 1 tablet (4 mg total) by mouth every 8 (eight) hours as needed. 10/13/24  Yes Leath-Warren, Etta PARAS, NP  azelastine  (ASTELIN ) 0.1 % nasal spray Place 1 spray into both nostrils 2 (two) times daily. Use in each nostril as directed 10/11/24   Stuart Vernell Norris, PA-C  cyclobenzaprine  (FLEXERIL ) 10 MG tablet Take 1 tablet (10 mg total) by mouth 3 (three) times daily as needed for muscle spasms. Patient not taking: Reported on 02/15/2022 01/17/22   Zammit, Joseph, MD  famotidine  (PEPCID ) 20 MG tablet Take 1 tablet (20 mg total) by mouth 2 (two) times daily. 02/15/22   Emelia Sluder, PA-C  fluticasone  (FLONASE ) 50 MCG/ACT nasal spray Place 2 sprays into both nostrils daily. 06/18/24    Leath-Warren, Etta PARAS, NP  ibuprofen  (ADVIL ) 800 MG tablet Take 1 tablet (800 mg total) by mouth every 8 (eight) hours as needed for moderate pain. 01/17/22   Zammit, Joseph, MD  oseltamivir  (TAMIFLU ) 75 MG capsule Take 1 capsule (75 mg total) by mouth every 12 (twelve) hours. 10/11/24   Stuart Vernell Norris, PA-C  pantoprazole  (PROTONIX ) 20 MG tablet Take 1 tablet (20 mg total) by mouth daily. 12/30/22 01/29/23  Robinson, John K, PA-C  promethazine -dextromethorphan (PROMETHAZINE -DM) 6.25-15 MG/5ML syrup Take 5 mLs by mouth 4 (four) times daily as needed. 10/11/24   Stuart Vernell Norris, PA-C  cetirizine  (ZYRTEC ) 10 MG tablet Take 1 tablet (10 mg total) by mouth daily. 07/04/19 11/30/20  Donah Penne DELENA, PA-C    Family History Family History  Problem Relation Age of Onset   Diabetes Other     Social History Social History[1]   Allergies   Cinnamon and Chocolate   Review of Systems Review of Systems Per HPI  Physical Exam Triage Vital Signs ED Triage Vitals  Encounter Vitals Group     BP --      Girls Systolic BP Percentile --      Girls Diastolic BP Percentile --      Boys Systolic BP Percentile --      Boys Diastolic BP Percentile --      Pulse Rate 10/13/24 1549 69     Resp 10/13/24 1549 16     Temp 10/13/24  1549 97.7 F (36.5 C)     Temp Source 10/13/24 1549 Oral     SpO2 10/13/24 1549 97 %     Weight --      Height --      Head Circumference --      Peak Flow --      Pain Score 10/13/24 1551 8     Pain Loc --      Pain Education --      Exclude from Growth Chart --    No data found.  Updated Vital Signs Pulse 69   Temp 97.7 F (36.5 C) (Oral)   Resp 16   SpO2 97%   Visual Acuity Right Eye Distance:   Left Eye Distance:   Bilateral Distance:    Right Eye Near:   Left Eye Near:    Bilateral Near:     Physical Exam Vitals and nursing note reviewed.  Constitutional:      General: He is not in acute distress.    Appearance: Normal  appearance.  HENT:     Head: Normocephalic.     Right Ear: Ear canal and external ear normal. A middle ear effusion is present.     Left Ear: Ear canal and external ear normal. A middle ear effusion is present.     Nose: Congestion present.     Right Turbinates: Enlarged and swollen.     Left Turbinates: Enlarged and swollen.     Right Sinus: No maxillary sinus tenderness or frontal sinus tenderness.     Left Sinus: No maxillary sinus tenderness or frontal sinus tenderness.     Mouth/Throat:     Lips: Pink.     Mouth: Mucous membranes are moist.     Pharynx: Postnasal drip present. No pharyngeal swelling, oropharyngeal exudate, posterior oropharyngeal erythema or uvula swelling.     Comments: Cobblestoning present to posterior oropharynx  Eyes:     Extraocular Movements: Extraocular movements intact.     Conjunctiva/sclera: Conjunctivae normal.     Pupils: Pupils are equal, round, and reactive to light.  Cardiovascular:     Rate and Rhythm: Normal rate and regular rhythm.     Pulses: Normal pulses.     Heart sounds: Normal heart sounds.  Pulmonary:     Effort: Pulmonary effort is normal. No respiratory distress.     Breath sounds: Normal breath sounds. No stridor. No wheezing, rhonchi or rales.  Abdominal:     General: Bowel sounds are normal.     Palpations: Abdomen is soft.  Musculoskeletal:     Cervical back: Normal range of motion.  Skin:    General: Skin is warm and dry.  Neurological:     General: No focal deficit present.     Mental Status: He is alert and oriented to person, place, and time.  Psychiatric:        Mood and Affect: Mood normal.        Behavior: Behavior normal.      UC Treatments / Results  Labs (all labs ordered are listed, but only abnormal results are displayed) Labs Reviewed - No data to display  EKG   Radiology No results found.  Procedures Procedures (including critical care time)  Medications Ordered in UC Medications - No data to  display  Initial Impression / Assessment and Plan / UC Course  I have reviewed the triage vital signs and the nursing notes.  Pertinent labs & imaging results that were available during my care of  the patient were reviewed by me and considered in my medical decision making (see chart for details).  Patient presents for follow-up for continued influenza symptoms.  He reports generalized bodyaches, nausea, vomiting, and fever.  He did not start the Tamiflu  within the first 48 hours of symptoms, states he started the Tamiflu  today.  He was also prescribed azelastine  nasal spray and Promethazine  DM for his cough.  Will start patient on ondansetron  4 mg ODT for nausea and vomiting.  Patient advised to continue the current medication as previously prescribed.  Supportive care recommendations were provided and discussed with the patient to include fluids, rest, over-the-counter analgesics, and use of normal saline nasal spray.  Patient was given follow-up indications.  Patient was in agreement with this plan of care and verbalizes understanding.  All questions were answered.  Patient stable for discharge.   Final Clinical Impressions(s) / UC Diagnoses   Final diagnoses:  Influenza A     Discharge Instructions      Take medication as prescribed. Continue the cough medicine you were previously prescribed along with the nasal spray.  This will help with the feeling of fullness in your ears. Recommend normal saline nasal spray throughout the day for nasal congestion or runny nose. For your cough, recommend use of a humidifier in your bedroom at nighttime during sleep and sleeping elevated on pillows while symptoms persist. Recommend over-the-counter Tylenol  or ibuprofen  as needed for pain, fever, or general discomfort. Symptoms should begin to improve over the next 5 to 7 days.  If symptoms fail to improve, or begin to worsen, please follow-up with your primary care physician for further  evaluation. Follow-up as needed.     ED Prescriptions     Medication Sig Dispense Auth. Provider   ondansetron  (ZOFRAN -ODT) 4 MG disintegrating tablet Take 1 tablet (4 mg total) by mouth every 8 (eight) hours as needed. 20 tablet Leath-Warren, Etta PARAS, NP      PDMP not reviewed this encounter.    [1]  Social History Tobacco Use   Smoking status: Former    Types: E-cigarettes   Smokeless tobacco: Former    Types: Chew   Tobacco comments:    2 cigarettes daily   Vaping Use   Vaping status: Every Day   Substances: Nicotine  Substance Use Topics   Alcohol use: Yes    Alcohol/week: 5.0 standard drinks of alcohol    Types: 5 Cans of beer per week    Comment: occasionally   Drug use: Yes    Types: Marijuana    Comment: CBD, Hemp     Leath-Warren, Etta PARAS, NP 10/13/24 1603  "

## 2024-11-08 ENCOUNTER — Ambulatory Visit
Admission: EM | Admit: 2024-11-08 | Discharge: 2024-11-08 | Disposition: A | Discharge status: Home / Self Care | Payer: PRIVATE HEALTH INSURANCE | Attending: Family Medicine | Admitting: Family Medicine

## 2024-11-08 DIAGNOSIS — R112 Nausea with vomiting, unspecified: Secondary | ICD-10-CM | POA: Diagnosis not present

## 2024-11-08 DIAGNOSIS — R197 Diarrhea, unspecified: Secondary | ICD-10-CM | POA: Diagnosis not present

## 2024-11-08 LAB — POC COVID19/FLU A&B COMBO
Covid Antigen, POC: NEGATIVE
Influenza A Antigen, POC: NEGATIVE
Influenza B Antigen, POC: NEGATIVE

## 2024-11-08 MED ORDER — ONDANSETRON 4 MG PO TBDP: 4.0000 mg | ORAL_TABLET | Freq: Three times a day (TID) | ORAL | 0 refills | Status: AC | PRN

## 2024-11-08 MED ORDER — ONDANSETRON 4 MG PO TBDP
4.0000 mg | ORAL_TABLET | Freq: Once | ORAL | Status: AC
  Administered 2024-11-08: 4 mg via ORAL

## 2024-11-08 NOTE — ED Provider Notes (Addendum)
 " [facility]    CSN: [id number] Arrival date & time: 11/08/24  1930      History   Chief Complaint Chief Complaint  Patient presents with   Abdominal Pain   Nausea    HPI Derek White is a 34 y.o. male.   Patient presenting states 1 day history of nausea, vomiting, diarrhea, headache.  Thinks he may have ate something bad and gotten food poisoning the day before.  Denies fever, upper respiratory symptoms, new medications, recent sick contacts, hematemesis, melena.  So far not trying anything over-the-counter for symptoms.    Past Medical History:  Diagnosis Date   GERD (gastroesophageal reflux disease)    Panic attacks     There are no active problems to display for this patient.   History reviewed. No pertinent surgical history.     Home Medications    Prior to Admission medications  Medication Sig Start Date End Date Taking? Authorizing Provider  ondansetron  (ZOFRAN -ODT) 4 MG disintegrating tablet Take 1 tablet (4 mg total) by mouth every 8 (eight) hours as needed for nausea or vomiting. 11/08/24  Yes Stuart Vernell Norris, PA-C  azelastine  (ASTELIN ) 0.1 % nasal spray Place 1 spray into both nostrils 2 (two) times daily. Use in each nostril as directed 10/11/24   Stuart Vernell Norris, PA-C  cyclobenzaprine  (FLEXERIL ) 10 MG tablet Take 1 tablet (10 mg total) by mouth 3 (three) times daily as needed for muscle spasms. Patient not taking: Reported on 02/15/2022 01/17/22   Zammit, Joseph, MD  famotidine  (PEPCID ) 20 MG tablet Take 1 tablet (20 mg total) by mouth 2 (two) times daily. 02/15/22   Emelia Sluder, PA-C  fluticasone  (FLONASE ) 50 MCG/ACT nasal spray Place 2 sprays into both nostrils daily. 06/18/24   Leath-Warren, Etta PARAS, NP  ibuprofen  (ADVIL ) 800 MG tablet Take 1 tablet (800 mg total) by mouth every 8 (eight) hours as needed for moderate pain. 01/17/22   Zammit, Joseph, MD  ondansetron  (ZOFRAN -ODT) 4 MG disintegrating tablet Take 1 tablet (4 mg  total) by mouth every 8 (eight) hours as needed. 10/13/24   Leath-Warren, Etta PARAS, NP  oseltamivir  (TAMIFLU ) 75 MG capsule Take 1 capsule (75 mg total) by mouth every 12 (twelve) hours. 10/11/24   Stuart Vernell Norris, PA-C  pantoprazole  (PROTONIX ) 20 MG tablet Take 1 tablet (20 mg total) by mouth daily. 12/30/22 01/29/23  Lang Norleen POUR, PA-C  promethazine -dextromethorphan (PROMETHAZINE -DM) 6.25-15 MG/5ML syrup Take 5 mLs by mouth 4 (four) times daily as needed. 10/11/24   Stuart Vernell Norris, PA-C  cetirizine  (ZYRTEC ) 10 MG tablet Take 1 tablet (10 mg total) by mouth daily. 07/04/19 11/30/20  Donah Penne DELENA, PA-C    Family History Family History  Problem Relation Age of Onset   Diabetes Other     Social History Social History[1]   Allergies   Cinnamon and Chocolate   Review of Systems Review of Systems Per HPI  Physical Exam Triage Vital Signs ED Triage Vitals  Encounter Vitals Group     BP 11/08/24 1941 116/76     Girls Systolic BP Percentile --      Girls Diastolic BP Percentile --      Boys Systolic BP Percentile --      Boys Diastolic BP Percentile --      Pulse Rate 11/08/24 1941 76     Resp 11/08/24 1941 18     Temp 11/08/24 1941 98.8 F (37.1 C)     Temp Source 11/08/24 1941 Oral  SpO2 11/08/24 1941 95 %     Weight --      Height --      Head Circumference --      Peak Flow --      Pain Score 11/08/24 1939 9     Pain Loc --      Pain Education --      Exclude from Growth Chart --    No data found.  Updated Vital Signs BP 116/76 (BP Location: Right Arm)   Pulse 76   Temp 98.8 F (37.1 C) (Oral)   Resp 18   SpO2 95%   Visual Acuity Right Eye Distance:   Left Eye Distance:   Bilateral Distance:    Right Eye Near:   Left Eye Near:    Bilateral Near:     Physical Exam Vitals and nursing note reviewed.  Constitutional:      Appearance: Normal appearance.  HENT:     Head: Atraumatic.  Eyes:     Extraocular Movements:  Extraocular movements intact.     Conjunctiva/sclera: Conjunctivae normal.  Cardiovascular:     Rate and Rhythm: Normal rate.  Pulmonary:     Effort: Pulmonary effort is normal.  Abdominal:     General: Bowel sounds are normal. There is no distension.     Palpations: Abdomen is soft.     Tenderness: There is no abdominal tenderness. There is no right CVA tenderness, left CVA tenderness or guarding.  Musculoskeletal:        General: Normal range of motion.     Cervical back: Normal range of motion and neck supple.  Skin:    General: Skin is warm and dry.  Neurological:     Mental Status: He is oriented to person, place, and time.  Psychiatric:        Mood and Affect: Mood normal.        Thought Content: Thought content normal.        Judgment: Judgment normal.      UC Treatments / Results  Labs (all labs ordered are listed, but only abnormal results are displayed) Labs Reviewed  POC COVID19/FLU A&B COMBO    EKG   Radiology No results found.  Procedures Procedures (including critical care time)  Medications Ordered in UC Medications  ondansetron  (ZOFRAN -ODT) disintegrating tablet 4 mg (has no administration in time range)    Initial Impression / Assessment and Plan / UC Course  I have reviewed the triage vital signs and the nursing notes.  Pertinent labs & imaging results that were available during my care of the patient were reviewed by me and considered in my medical decision making (see chart for details).     Rapid flu and COVID neg. Suspect viral GI illness versus foodborne illness.  Will treat with Zofran , BRAT diet, fluids, rest.  Work note given.  Return for worsening or unresolving symptoms.  No red flag findings today.  Final Clinical Impressions(s) / UC Diagnoses   Final diagnoses:  Nausea vomiting and diarrhea   Discharge Instructions   None    ED Prescriptions     Medication Sig Dispense Auth. Provider   ondansetron  (ZOFRAN -ODT) 4 MG  disintegrating tablet Take 1 tablet (4 mg total) by mouth every 8 (eight) hours as needed for nausea or vomiting. 20 tablet Stuart Vernell Norris, [place]      PDMP not reviewed this encounter.    Stuart Vernell Norris, [place] 11/08/24 1951     [1]  Social History Tobacco  Use   Smoking status: Former    Types: E-cigarettes   Smokeless tobacco: Former    Types: Chew   Tobacco comments:    2 cigarettes daily   Vaping Use   Vaping status: Every Day   Substances: Nicotine  Substance Use Topics   Alcohol use: Yes    Alcohol/week: 5.0 standard drinks of alcohol    Types: 5 Cans of beer per week    Comment: occasionally   Drug use: Yes    Types: Marijuana    Comment: CBD, Hemp     Stuart Vernell Norris, [place] 11/08/24 1953  "

## 2024-11-08 NOTE — ED Triage Notes (Signed)
 Nausea, vomiting, diarrhea, abdominal pain x 1 day. Pt thinks he got food poison from eating out last night.
